# Patient Record
Sex: Female | Born: 1935 | Race: Black or African American | Hispanic: No | Smoking: Never smoker
Health system: Southern US, Community
[De-identification: ages and names within clinical notes are randomized; demographics above are authoritative.]

## PROBLEM LIST (undated history)

## (undated) DIAGNOSIS — M1990 Unspecified osteoarthritis, unspecified site: Secondary | ICD-10-CM

## (undated) DIAGNOSIS — L409 Psoriasis, unspecified: Secondary | ICD-10-CM

## (undated) DIAGNOSIS — D649 Anemia, unspecified: Secondary | ICD-10-CM

## (undated) DIAGNOSIS — Z973 Presence of spectacles and contact lenses: Secondary | ICD-10-CM

## (undated) DIAGNOSIS — I10 Essential (primary) hypertension: Secondary | ICD-10-CM

## (undated) DIAGNOSIS — E785 Hyperlipidemia, unspecified: Secondary | ICD-10-CM

## (undated) DIAGNOSIS — H269 Unspecified cataract: Secondary | ICD-10-CM

## (undated) DIAGNOSIS — M2550 Pain in unspecified joint: Secondary | ICD-10-CM

## (undated) DIAGNOSIS — R413 Other amnesia: Secondary | ICD-10-CM

## (undated) DIAGNOSIS — C55 Malignant neoplasm of uterus, part unspecified: Secondary | ICD-10-CM

## (undated) DIAGNOSIS — N950 Postmenopausal bleeding: Secondary | ICD-10-CM

## (undated) HISTORY — DX: Malignant neoplasm of uterus, part unspecified: C55

## (undated) HISTORY — PX: TONSILLECTOMY: SUR1361

## (undated) HISTORY — PX: COLONOSCOPY: SHX174

---

## 1998-12-01 ENCOUNTER — Other Ambulatory Visit: Admission: RE | Admit: 1998-12-01 | Discharge: 1998-12-01 | Payer: Self-pay | Admitting: Gynecology

## 1999-12-11 ENCOUNTER — Other Ambulatory Visit: Admission: RE | Admit: 1999-12-11 | Discharge: 1999-12-11 | Payer: Self-pay | Admitting: Gynecology

## 2001-03-25 ENCOUNTER — Encounter: Payer: Self-pay | Admitting: Specialist

## 2001-03-25 ENCOUNTER — Ambulatory Visit (HOSPITAL_COMMUNITY): Admission: RE | Admit: 2001-03-25 | Discharge: 2001-03-25 | Payer: Self-pay | Admitting: Specialist

## 2001-03-25 HISTORY — PX: KNEE ARTHROSCOPY: SUR90

## 2001-06-10 ENCOUNTER — Encounter: Admission: RE | Admit: 2001-06-10 | Discharge: 2001-06-10 | Payer: Self-pay | Admitting: Specialist

## 2001-06-10 ENCOUNTER — Encounter: Payer: Self-pay | Admitting: Specialist

## 2001-08-21 ENCOUNTER — Inpatient Hospital Stay (HOSPITAL_COMMUNITY): Admission: EM | Admit: 2001-08-21 | Discharge: 2001-08-23 | Payer: Self-pay | Admitting: Emergency Medicine

## 2001-09-02 ENCOUNTER — Encounter: Admission: RE | Admit: 2001-09-02 | Discharge: 2001-09-02 | Payer: Self-pay | Admitting: Family Medicine

## 2001-10-16 ENCOUNTER — Ambulatory Visit (HOSPITAL_COMMUNITY): Admission: RE | Admit: 2001-10-16 | Discharge: 2001-10-16 | Payer: Self-pay | Admitting: Gynecology

## 2001-10-16 ENCOUNTER — Encounter: Payer: Self-pay | Admitting: Gynecology

## 2001-11-10 ENCOUNTER — Encounter: Payer: Self-pay | Admitting: Gynecology

## 2001-11-12 ENCOUNTER — Encounter (INDEPENDENT_AMBULATORY_CARE_PROVIDER_SITE_OTHER): Payer: Self-pay

## 2001-11-12 ENCOUNTER — Ambulatory Visit (HOSPITAL_COMMUNITY): Admission: RE | Admit: 2001-11-12 | Discharge: 2001-11-12 | Payer: Self-pay | Admitting: Gynecology

## 2001-11-12 HISTORY — PX: DILATION AND CURETTAGE OF UTERUS: SHX78

## 2003-01-19 ENCOUNTER — Other Ambulatory Visit: Admission: RE | Admit: 2003-01-19 | Discharge: 2003-01-19 | Payer: Self-pay | Admitting: Gynecology

## 2006-12-27 ENCOUNTER — Other Ambulatory Visit: Admission: RE | Admit: 2006-12-27 | Discharge: 2006-12-27 | Payer: Self-pay | Admitting: Family Medicine

## 2010-02-15 ENCOUNTER — Encounter: Admission: RE | Admit: 2010-02-15 | Discharge: 2010-02-15 | Payer: Self-pay | Admitting: Orthopaedic Surgery

## 2010-12-22 NOTE — Discharge Summary (Signed)
[place]. [facility]  Patient:    Cristina Barton, Cristina Barton Visit Number: [id number] MRN: [id number]          Service Type: MED Location: [id number] Attending Physician:  McDiarmid, Leighton Roach. Dictated by:   Emmit Alexanders, M.D. Admit Date:  08/21/2001 Discharge Date: 08/23/2001   CC:         Maricela Bo, M.D.                           Discharge Summary  DISCHARGE DIAGNOSES: 1. 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 2. Hypertension. 3. Osteoarthritis.  CONSULTING PHYSICIANS:  Dermatology was unavailable for consult.  LABORATORY DATA:  The patients CBC at discharge showed a white blood cell count of 8.2, hemoglobin 12.4, hematocrit 36.1, and platelet count 188,000. BMET at discharge showed sodium 140, potassium 4.5, chloride 108, CO2 28, BUN 7, creatinine 0.8, glucose 165.  LFTs showed AST 29, ALT 27, alkaline phosphatase 51, total bilirubin 0.3.  Peripheral smear to rule out leukemia and lymphoma was pending at discharge.  HOSPITAL COURSE:  #1 - Erythroderma.  This is a 75 year old African-American female who presented to her primary care physicians office with a rash starting on December 18.  Rash seemed to worsen despite systemic steroids.  She was also being treated with Periactin for itching p.r.n.  The patient was seen by her dermatologist who recommended admission with the diagnosis of erythroderma. She was therefore admitted for observation to make sure her vital signs remain stable and that her skin integrity is remaining intact.  In the hospital, she was treated with triamcinolone 1% cream apply to the skin as needed.  She was also maintained on Periactin as she was as an outpatient.  Over the two-day course, the patients symptoms remained stable.  She had a diffuse erythematous confluent rash involving her face, torso, and arms as well as her upper legs.  It spared the palms and soles of her feet.  There were some associated  satelite pinpoint nonblanching lesions at the periphery of her rash involving the anterior aspect of her legs.  There is diffuse scaling throughout her body as well.  Her vital signs remained stable throughout her course.  She was given intravenous fluids, but was tolerating p.o. well.  She will be discharged to home with follow-up with her primary care physician, but in the meantime should maintain using her triamcinolone cream and Periactin. We discontinued her Diltiazem while she was in the hospital as this may be contributing factor.  Other possible causes include Celebrex which she was taking for six months prior to her rash.  Of note, she also took ciprofloxacin at her urologists office on November 25.  This may definitely also be contributing as well.  For now, she should discontinue all unnecessary medicines except for those for support.  She should ask her primary care physician for recommendations on when to continue these.  We also recommend a punch biopsy of a lesion to rule out any primary dermatological disease. This can be done as a outpatient.  Her peripheral blood smear should also be followed up to rule out any associated leukemias or lymphomas.  She does not have any risk factors for any other associated cancers including cervical, lung, or colon cancer.  #2 - Hypertension.  The patients blood pressure remained stable throughout her course.  She was initially given her Diltiazem as she takes as an outpatient.  However,  this was discontinued on her second day secondary to avoiding any possible contributing sequelae from taking this medicine.  She should follow the advice of her primary care physician regarding resuming taking this medicine.  #3 - Osteoarthritis.  The patients arthritis was stable.  She was not given any medicine in the hospital.  Again, as above, Celebrex may be contributing to this rash.  She should not take any further medicines until she has  further given advice to do so.  DISCHARGE MEDICATIONS: 1. Triamcinolone cream 1% apply to skin p.r.n. 2. Periactin 4 mg p.o. t.i.d.  ACTIVITY:  As tolerated.  She may take baths with a mild soap such as Dove soap.  If her rash worsens, she should stop all daily activity.  DIET:  No restrictions.  DISCHARGE INSTRUCTIONS:  She is to come back to the hospital for worsening of rash, difficulty breathing, chest pain, or lightheadedness.  FOLLOW-UP:  She is to call Dr. Chrys Racer office for an appointment as early as possible.  She should also possibly call her dermatologist for a repeat follow-up appointment. Dictated by:   Emmit Alexanders, M.D. Attending Physician:  McDiarmid, Tawanna Cooler D. DD:  08/23/01 TD:  08/25/01 Job: [id number]

## 2010-12-22 NOTE — Op Note (Signed)
[facility]  Patient:    Cristina Barton, Cristina Barton Visit Number: [id number] MRN: [id number]          Service Type: DSU Location: DAY Attending Physician:  Rocky Crafts Proc. Date: 03/25/01 Adm. Date:  03/25/2001                             Operative Report  HISTORY:  This is a 75 year old with left knee pain for two months along with some knee swelling.  She has not had good relief with medication.  An MRI of the left knee reported a complex tear of the medial meniscus.  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  She had moderate knee effusion and moderate Bakers cyst. It was hoped that most of the pain was coming from the complex tear of the medial meniscus than from the aseptic necrosis.  DESCRIPTION OF PROCEDURE:  After suitable general anesthesia, the knee was prepped and draped routinely for arthroscopy.  After exsanguination, upper thigh tourniquet was inflated to 400 mmHg.  Routine portals were created with a lateral parapatellar inflow portal and an anterolateral portal for the scope, and an inferomedial portal made under direct vision, bringing a probe. The medial meniscus was inspected first and probed.  There was some mild degenerative changes over the medial femoral condyle but nothing specific. The meniscus itself was probed normal and although posterior seemed a little thicker, it was perfectly normal, and there was no evidence of any instability, tearing, or problems within it, despite the MRI report.  The lateral meniscus looked quite good as well.  There were no changes on the articular surface of the lateral femoral condyle.  It was felt that the meniscal tear, as reported by the MRI, was not a significant tear and not a significant part of her pain problem.  That leads [place] to the  spontaneous osteonecrosis as the probable cause.  At the end of the procedure, 20 cc of 0.5% plain Marcaine in the knee with some in the portals and a nice compression dressing.  She goes to recovery in good condition. Attending Physician:  Rocky Crafts DD:  03/25/01 TD:  03/25/01 Job: [id number]

## 2010-12-22 NOTE — Op Note (Signed)
[facility]  Patient:    Cristina Barton, Cristina Barton Visit Number: [id number] MRN: [id number]          Service Type: DSU Location: [facility] Attending Physician:  Susa Raring Dictated by:   Luvenia Redden, M.D. Proc. Date: 11/12/01 Admit Date:  11/12/2001                             Operative Report  PREOPERATIVE DIAGNOSES:       Postmenopausal bleeding, uterine fibroids.  POSTOPERATIVE DIAGNOSES:      Postmenopausal bleeding, uterine fibroids, rule out endometrial carcinoma.  SURGEON:                      Luvenia Redden, M.D.  PROCEDURE:                    Under good anesthesia the patient is prepped and draped in a sterile manner.  Bladder was catheterized.  Examination reveals the uterus anterior, enlarged, irregular, and about 10-12 weeks or so gestational size.  No adnexal masses could be palpated separately from the uterus.  Cervix was grasped with a tenaculum and the os was stenotic.  It was sounded with a little bit of difficulty, but was sounded successfully.  The uterine cavity was 10-11 cm deep.  Cervix was dilated up with cervical dilators.  The endocervical canal was curetted.  This was sent as a separate specimen.  The cavity was then explored with polyp forceps and several fragments of tissue were obtained.  The endometrial cavity was then scraped thoroughly with a sharp curette.  A small to moderate amount of tissue was obtained.  The uterine cavity was then wiped with a dry sponge.  There was no residual tissue that came out with the sponge.  There was no excess bleeding. Patient tolerated the procedure well and was removed to recovery in good condition.  Estimated blood loss 25 cc.  None replaced. Dictated by:   Luvenia Redden, M.D. Attending Physician:  Susa Raring DD:  11/12/01 TD:  11/12/01 Job: [id number]

## 2010-12-22 NOTE — H&P (Signed)
[place]. [facility]  Patient:    Cristina Barton, WAITMAN Visit Number: [id number] MRN: [id number]          Service Type: MED Location: 5500 [id number] Attending Physician:  Adonis Housekeeper Dictated by:   Gwenlyn Perking, M.D. Admit Date:  08/21/2001   CC:         Maricela Bo, M.D., [place],    History and Physical  CHIEF COMPLAINT/PROBLEM LIST:  1. Rash.  2. Hypertension.  PRIMARY CARE PHYSICIAN: Maricela Bo, M.D., [place], [place].  HISTORY OF PRESENT ILLNESS: The patient is a 75 year old African-American female, with a history of a rash since July 23, 2001.  The patient states that she went to see her primary M.D., Dr. Purnell Shoemaker in [place], [place], who gave her a shot and stopped her Celebrex that she takes for osteoarthritis and that she has been taking for the last half year.  This really did not improve her rash and Dr. Purnell Shoemaker gave her a prescription for cyproheptadine as well as Eucerin mixed with hydrocortisone, which also did not improve the rash.  She also recalls that she went to have an ultrasound of her gallbladder done in the beginning of December 2002 in [place], [place] with a urologist, an "[place] doctor," at which time she was given seven pills which made her very nauseated.  Shortly thereafter her rash came on.  The patient denies any history of weight loss or arthralgias or myalgias.  No exposure to ticks or insects.  Denies history of fevers and chills until about a week ago, and now feels subjectively hot and feverish for the last seven days.  Does not recall ever having had a rash like this before, nor does anybody else in her family have a rash like that.  PAST MEDICAL HISTORY:  1. Hypertension.  2. Osteoarthritis.  PAST SURGICAL HISTORY: Arthroscopy on right knee.  ALLERGIES: Questionable, CELEBREX.  MEDICATIONS:  1. Evista was stopped.  2. Celebrex was stopped.  3. Methylprednisolone, patient  just finished a taper.  4. Cyproheptadine 4 mg p.o. t.i.d. p.r.n.  5. Cardizem 240 mg p.o. q.d.  FAMILY HISTORY: No rashes.  Has two sisters who are alive and healthy.  SOCIAL HISTORY: The patient is married.  Disabled from osteoarthritis of her lower extremities.  The patient has worked at a [employer] all of her life. No smoking, no alcohol.  REVIEW OF SYSTEMS: GENERAL: No weight loss.  Positive appetite.  Positive chills and fevers x7 days.  HEENT: Noncontributory.  CARDIOVASCULAR: No chest pain.  RESPIRATORY: No cough, no wheezing, no shortness of breath, no dyspnea on exertion.  GI: Bowel movement daily.  No melena, no hematochezia, no nausea, no vomiting.  EXTREMITIES: No swelling or joint pains.  GU: Positive for bladder problems, for which she had ultrasound as noted in History of Present Illness.  NEUROLOGIC: No history of stroke.  ENDOCRINOLOGIC: No history of diabetes.  PHYSICAL EXAMINATION:  VITAL SIGNS: Temperature 98.5 degrees, heart rate 80, blood pressure 129/70, respirations 20.  GENERAL: Well-developed, well-nourished 75 year old African-American female, awake and alert, who appears comfortable and in no apparent distress.  HEENT: Normocephalic, atraumatic.  PERRLA.  EOMI.  Conjunctivae anicteric and do not have any lesions.  Eyelids are mildly erythematous and edematous bilaterally.  Ears, nose, and throat otherwise within normal limits. Specifically, the mouth does not contain any lesions or blisters.  There is, furthermore, no angioedema noted of her lips.  NECK: No bruits.  CARDIOVASCULAR:  Regular rate and rhythm without murmurs, rubs, or gallops.  RESPIRATORY: Clear to auscultation bilaterally.  ABDOMEN: Protuberant but soft.  Normoactive bowel sounds.  No hepatosplenomegaly.  EXTREMITIES: No clubbing, cyanosis, or edema.  NEUROLOGIC: Cranial nerves II-XII grossly intact.  No focal sensory or motor deficits.  No ataxia.  SKIN: Confluent  erythematous rash on torso, back, and proximal upper and lower extremities, as well as face.  There is very distinct sparing of her palms, soles, dorsum of her feet and hands noted.  LABORATORY DATA: H&H 13.1 and 39.5, WBC 12.1 with Differential of 74% neutrophils, 16% lymphocytes, 7% monocytes, 3% eosinophils, 1% basophils; platelets 165,000; MCV 85; RDW 14.9.  Serum electrolytes as follows: Sodium 140, potassium 4.1, BUN 10, creatinine 0.9, chloride 104, CO2 29, glucose 115. AST and ALT 29 and 27 respectively.  ASSESSMENT: This is a 75 year old white female with generalized rash, nonpruritic, with sparing of hands and feet.  According to the dermatologist in [place], [place] this is erythroderma, possibly related to ingestion of sulfa containing drugs and a subsequent drug reaction (Celebrex). Patient also with history of hypertension, which appears to be well controlled.  PLAN:  1. Admit patient to family practice teaching service.  2. Supportive treatment with electrolytes, fluid, and close monitoring of     vitals.  3. Symptomatic treatment with cyproheptadine as well as triamcinolone     cream 0.1%, but no systemic steroids on advice of dermatologist in     [place], [place].  4. Will continue Cardizem 240 mg p.o. q.d.  5. Discussed this patient with attending physician, Dr. Guadalupe Dawn. Dictated by:   Gwenlyn Perking, M.D. Attending Physician:  Adonis Housekeeper. DD:  08/21/01 TD:  08/22/01 Job: [id number]

## 2011-03-02 ENCOUNTER — Other Ambulatory Visit: Payer: Self-pay | Admitting: Family Medicine

## 2011-03-02 DIAGNOSIS — Z1231 Encounter for screening mammogram for malignant neoplasm of breast: Secondary | ICD-10-CM

## 2011-03-07 ENCOUNTER — Ambulatory Visit
Admission: RE | Admit: 2011-03-07 | Discharge: 2011-03-07 | Disposition: A | Discharge status: Home / Self Care | Payer: Medicare Other | Source: Ambulatory Visit | Attending: Family Medicine | Admitting: Family Medicine

## 2011-03-07 DIAGNOSIS — Z1231 Encounter for screening mammogram for malignant neoplasm of breast: Secondary | ICD-10-CM

## 2011-03-12 ENCOUNTER — Other Ambulatory Visit: Payer: Self-pay | Admitting: Family Medicine

## 2011-03-12 DIAGNOSIS — R928 Other abnormal and inconclusive findings on diagnostic imaging of breast: Secondary | ICD-10-CM

## 2011-03-16 ENCOUNTER — Ambulatory Visit
Admission: RE | Admit: 2011-03-16 | Discharge: 2011-03-16 | Disposition: A | Discharge status: Home / Self Care | Payer: Medicare Other | Source: Ambulatory Visit | Attending: Family Medicine | Admitting: Family Medicine

## 2011-03-16 DIAGNOSIS — R928 Other abnormal and inconclusive findings on diagnostic imaging of breast: Secondary | ICD-10-CM

## 2011-08-13 DIAGNOSIS — L408 Other psoriasis: Secondary | ICD-10-CM | POA: Diagnosis not present

## 2011-08-13 DIAGNOSIS — Z79899 Other long term (current) drug therapy: Secondary | ICD-10-CM | POA: Diagnosis not present

## 2011-09-05 DIAGNOSIS — Z79899 Other long term (current) drug therapy: Secondary | ICD-10-CM | POA: Diagnosis not present

## 2011-09-05 DIAGNOSIS — E7800 Pure hypercholesterolemia, unspecified: Secondary | ICD-10-CM | POA: Diagnosis not present

## 2011-09-07 DIAGNOSIS — I10 Essential (primary) hypertension: Secondary | ICD-10-CM | POA: Diagnosis not present

## 2011-09-07 DIAGNOSIS — E7800 Pure hypercholesterolemia, unspecified: Secondary | ICD-10-CM | POA: Diagnosis not present

## 2011-09-07 DIAGNOSIS — R7309 Other abnormal glucose: Secondary | ICD-10-CM | POA: Diagnosis not present

## 2011-09-28 DIAGNOSIS — H2510 Age-related nuclear cataract, unspecified eye: Secondary | ICD-10-CM | POA: Diagnosis not present

## 2012-01-14 DIAGNOSIS — J069 Acute upper respiratory infection, unspecified: Secondary | ICD-10-CM | POA: Diagnosis not present

## 2012-01-14 DIAGNOSIS — R05 Cough: Secondary | ICD-10-CM | POA: Diagnosis not present

## 2012-02-18 DIAGNOSIS — K5730 Diverticulosis of large intestine without perforation or abscess without bleeding: Secondary | ICD-10-CM | POA: Diagnosis not present

## 2012-02-18 DIAGNOSIS — Z86010 Personal history of colonic polyps: Secondary | ICD-10-CM | POA: Diagnosis not present

## 2012-02-18 DIAGNOSIS — Z09 Encounter for follow-up examination after completed treatment for conditions other than malignant neoplasm: Secondary | ICD-10-CM | POA: Diagnosis not present

## 2012-02-26 DIAGNOSIS — M545 Low back pain: Secondary | ICD-10-CM | POA: Diagnosis not present

## 2012-02-26 DIAGNOSIS — M48061 Spinal stenosis, lumbar region without neurogenic claudication: Secondary | ICD-10-CM | POA: Diagnosis not present

## 2012-02-26 DIAGNOSIS — IMO0002 Reserved for concepts with insufficient information to code with codable children: Secondary | ICD-10-CM | POA: Diagnosis not present

## 2012-03-03 DIAGNOSIS — M5137 Other intervertebral disc degeneration, lumbosacral region: Secondary | ICD-10-CM | POA: Diagnosis not present

## 2012-03-07 ENCOUNTER — Other Ambulatory Visit (HOSPITAL_COMMUNITY): Payer: Self-pay | Admitting: Orthopaedic Surgery

## 2012-03-07 DIAGNOSIS — M545 Low back pain: Secondary | ICD-10-CM | POA: Diagnosis not present

## 2012-03-07 DIAGNOSIS — M48061 Spinal stenosis, lumbar region without neurogenic claudication: Secondary | ICD-10-CM | POA: Diagnosis not present

## 2012-03-10 DIAGNOSIS — L408 Other psoriasis: Secondary | ICD-10-CM | POA: Diagnosis not present

## 2012-03-11 ENCOUNTER — Encounter (HOSPITAL_COMMUNITY)
Admission: RE | Admit: 2012-03-11 | Discharge: 2012-03-11 | Disposition: A | Discharge status: Home / Self Care | Payer: Medicare Other | Source: Ambulatory Visit | Attending: Orthopaedic Surgery | Admitting: Orthopaedic Surgery

## 2012-03-11 ENCOUNTER — Encounter (HOSPITAL_COMMUNITY): Payer: Self-pay

## 2012-03-11 ENCOUNTER — Encounter (HOSPITAL_COMMUNITY): Payer: Self-pay | Admitting: Pharmacy Technician

## 2012-03-11 DIAGNOSIS — M48062 Spinal stenosis, lumbar region with neurogenic claudication: Secondary | ICD-10-CM | POA: Diagnosis not present

## 2012-03-11 DIAGNOSIS — Z01810 Encounter for preprocedural cardiovascular examination: Secondary | ICD-10-CM | POA: Diagnosis not present

## 2012-03-11 DIAGNOSIS — M129 Arthropathy, unspecified: Secondary | ICD-10-CM | POA: Diagnosis not present

## 2012-03-11 DIAGNOSIS — L408 Other psoriasis: Secondary | ICD-10-CM | POA: Diagnosis not present

## 2012-03-11 DIAGNOSIS — Z01812 Encounter for preprocedural laboratory examination: Secondary | ICD-10-CM | POA: Diagnosis not present

## 2012-03-11 DIAGNOSIS — I10 Essential (primary) hypertension: Secondary | ICD-10-CM | POA: Diagnosis not present

## 2012-03-11 DIAGNOSIS — Z5189 Encounter for other specified aftercare: Secondary | ICD-10-CM | POA: Diagnosis not present

## 2012-03-11 DIAGNOSIS — E785 Hyperlipidemia, unspecified: Secondary | ICD-10-CM | POA: Diagnosis not present

## 2012-03-11 HISTORY — DX: Hyperlipidemia, unspecified: E78.5

## 2012-03-11 HISTORY — DX: Unspecified osteoarthritis, unspecified site: M19.90

## 2012-03-11 HISTORY — DX: Pain in unspecified joint: M25.50

## 2012-03-11 HISTORY — DX: Unspecified cataract: H26.9

## 2012-03-11 HISTORY — DX: Psoriasis, unspecified: L40.9

## 2012-03-11 HISTORY — DX: Essential (primary) hypertension: I10

## 2012-03-11 LAB — COMPREHENSIVE METABOLIC PANEL
ALT: 16 U/L (ref 0–35)
AST: 19 U/L (ref 0–37)
Albumin: 3.7 g/dL (ref 3.5–5.2)
CO2: 29 mEq/L (ref 19–32)
Chloride: 104 mEq/L (ref 96–112)
GFR calc non Af Amer: 84 mL/min — ABNORMAL LOW (ref >90)
Sodium: 141 mEq/L (ref 135–145)
Total Bilirubin: 0.3 mg/dL (ref 0.3–1.2)

## 2012-03-11 LAB — CBC
Platelets: 178 10*3/uL (ref 150–400)
RBC: 5.08 MIL/uL (ref 3.87–5.11)
RDW: 13.2 % (ref 11.5–15.5)
WBC: 7.2 10*3/uL (ref 4.0–10.5)

## 2012-03-11 LAB — SURGICAL PCR SCREEN: Staphylococcus aureus: NEGATIVE

## 2012-03-11 MED ORDER — CEFAZOLIN SODIUM-DEXTROSE 2-3 GM-% IV SOLR
2.0000 g | INTRAVENOUS | Status: AC
  Administered 2012-03-12: 2 g via INTRAVENOUS

## 2012-03-11 NOTE — Consult Note (Signed)
Anesthesia Chart Review:  Patient is a 76 year old female posted for microdiskectomy/lumbar laminectomy (orders pending) on 03/12/12 by Dr. Ophelia Charter.  Her PAT appointment was earlier today, 03/11/12.    History includes non-smoker, obesity with BMI 33, HTN, HLD, cataracts, OA, psoriasis, prior tonsillectomy.  PCP is Dr. Nathanial Rancher in [place].  She denies prior cardiac testing and is not followed by a Cardiologist.  She has no documented history of CAD/MI/CHF or DM.  CXR on 03/11/12 showed no evidence of acute cardiopulmonary disease.    Labs from 03/11/12 are acceptable.  She is scheduled for a PT/PTT on the day of surgery.  EKG today showed SR with PAC, cannot rule out anterior infarct (age undetermined)--poor r wave progression from V2-V3.  She had this on an EKG from 11/10/01, but it is more apparent now.  Her other leads appear fairly similar.  Patient had already left the hospital before I was given her EKGs to review.  Clinical correlation on the day of surgery.  Would anticipate that if she is asymptomatic (no documented CV symptoms from her PAT appointment) that she could proceed, however, her assigned Anesthesiologist will evaluate her on the day of surgery.  Shonna Chock, PA-C 03/11/12 1600

## 2012-03-11 NOTE — Progress Notes (Signed)
i called hamrick's office to request an ekg for comparison to today's but per office pt hasn't had any others to send me

## 2012-03-11 NOTE — Progress Notes (Addendum)
Pt doesn't have a  Cardiologist  Denies ever having an echo/stress test/heart cath  Medical MD is Dr.Hamrick in [place]-ph # [phone]  Pt denies having an ekg or cxr within past yr

## 2012-03-11 NOTE — Progress Notes (Signed)
Pt notified to arrive at 10:30 d/t 28m time change;pt verbalized understanding

## 2012-03-11 NOTE — Pre-Procedure Instructions (Signed)
20 Cristina Barton  03/11/2012   Your procedure is scheduled on:  Wed, Aug 7 @ 12:45 PM  Report to [facility] at 10:45 AM.  Call this number if you have problems the morning of surgery: [phone]   Remember:   Do not eat food:After Midnight.    Take these medicines the morning of surgery with A SIP OF WATER:    Do not wear jewelry, make-up or nail polish.  Do not wear lotions, powders, or perfumes.   Do not shave 48 hours prior to surgery.  Do not bring valuables to the hospital.  Contacts, dentures or bridgework may not be worn into surgery.  Leave suitcase in the car. After surgery it may be brought to your room.  For patients admitted to the hospital, checkout time is 11:00 AM the day of discharge.   Patients discharged the day of surgery will not be allowed to drive home.    Special Instructions: CHG Shower Use Special Wash: 1/2 bottle night before surgery and 1/2 bottle morning of surgery.   Please read over the following fact sheets that you were given: Pain Booklet, Coughing and Deep Breathing, MRSA Information and Surgical Site Infection Prevention

## 2012-03-12 ENCOUNTER — Inpatient Hospital Stay (HOSPITAL_COMMUNITY): Payer: Medicare Other | Admitting: Vascular Surgery

## 2012-03-12 ENCOUNTER — Encounter (HOSPITAL_COMMUNITY): Payer: Self-pay | Admitting: Vascular Surgery

## 2012-03-12 ENCOUNTER — Ambulatory Visit (HOSPITAL_COMMUNITY)
Admission: RE | Admit: 2012-03-12 | Discharge: 2012-03-13 | Disposition: A | Discharge status: Home / Self Care | Payer: Medicare Other | Source: Ambulatory Visit | Attending: Orthopaedic Surgery | Admitting: Orthopaedic Surgery

## 2012-03-12 ENCOUNTER — Encounter (HOSPITAL_COMMUNITY): Payer: Self-pay | Admitting: Surgery

## 2012-03-12 ENCOUNTER — Inpatient Hospital Stay (HOSPITAL_COMMUNITY): Payer: Medicare Other

## 2012-03-12 ENCOUNTER — Encounter (HOSPITAL_COMMUNITY)
Admission: RE | Disposition: A | Discharge status: Home / Self Care | Payer: Self-pay | Source: Ambulatory Visit | Attending: Orthopaedic Surgery

## 2012-03-12 ENCOUNTER — Encounter (HOSPITAL_COMMUNITY): Payer: Self-pay | Admitting: General Practice

## 2012-03-12 DIAGNOSIS — Z01812 Encounter for preprocedural laboratory examination: Secondary | ICD-10-CM | POA: Insufficient documentation

## 2012-03-12 DIAGNOSIS — E785 Hyperlipidemia, unspecified: Secondary | ICD-10-CM | POA: Insufficient documentation

## 2012-03-12 DIAGNOSIS — M48061 Spinal stenosis, lumbar region without neurogenic claudication: Secondary | ICD-10-CM | POA: Diagnosis not present

## 2012-03-12 DIAGNOSIS — Z01810 Encounter for preprocedural cardiovascular examination: Secondary | ICD-10-CM | POA: Insufficient documentation

## 2012-03-12 DIAGNOSIS — M545 Low back pain: Secondary | ICD-10-CM | POA: Diagnosis not present

## 2012-03-12 DIAGNOSIS — M129 Arthropathy, unspecified: Secondary | ICD-10-CM | POA: Insufficient documentation

## 2012-03-12 DIAGNOSIS — I10 Essential (primary) hypertension: Secondary | ICD-10-CM | POA: Insufficient documentation

## 2012-03-12 DIAGNOSIS — L408 Other psoriasis: Secondary | ICD-10-CM | POA: Insufficient documentation

## 2012-03-12 DIAGNOSIS — M48062 Spinal stenosis, lumbar region with neurogenic claudication: Secondary | ICD-10-CM | POA: Insufficient documentation

## 2012-03-12 HISTORY — PX: LUMBAR LAMINECTOMY: SHX95

## 2012-03-12 HISTORY — PX: LAMINECTOMY AND MICRODISCECTOMY LUMBAR SPINE: SHX1913

## 2012-03-12 LAB — PROTIME-INR
INR: 1.08 (ref 0.00–1.49)
Prothrombin Time: 14.2 seconds (ref 11.6–15.2)

## 2012-03-12 LAB — APTT: aPTT: 28 seconds (ref 24–37)

## 2012-03-12 SURGERY — MICRODISCECTOMY LUMBAR LAMINECTOMY
Anesthesia: General | Site: Back | Wound class: Clean

## 2012-03-12 MED ORDER — 0.9 % SODIUM CHLORIDE (POUR BTL) OPTIME
TOPICAL | Status: DC | PRN
  Administered 2012-03-12: 1000 mL

## 2012-03-12 MED ORDER — METHOCARBAMOL 500 MG PO TABS
500.0000 mg | ORAL_TABLET | Freq: Four times a day (QID) | ORAL | Status: DC | PRN
  Filled 2012-03-12: qty 1

## 2012-03-12 MED ORDER — MORPHINE SULFATE 2 MG/ML IJ SOLN: 1.0000 mg | INTRAMUSCULAR | Status: DC | PRN

## 2012-03-12 MED ORDER — BUPIVACAINE HCL 0.75 % IJ SOLN
INTRAMUSCULAR | Status: AC
  Filled 2012-03-12: qty 10

## 2012-03-12 MED ORDER — VERAPAMIL HCL ER 240 MG PO TBCR
240.0000 mg | EXTENDED_RELEASE_TABLET | Freq: Every day | ORAL | Status: DC
  Administered 2012-03-12: 240 mg via ORAL
  Filled 2012-03-12 (×2): qty 1

## 2012-03-12 MED ORDER — BISACODYL 10 MG RE SUPP: 10.0000 mg | Freq: Every day | RECTAL | Status: DC | PRN

## 2012-03-12 MED ORDER — BUPIVACAINE HCL (PF) 0.25 % IJ SOLN
INTRAMUSCULAR | Status: DC | PRN
  Administered 2012-03-12: 30 mL

## 2012-03-12 MED ORDER — BUPIVACAINE HCL (PF) 0.25 % IJ SOLN
INTRAMUSCULAR | Status: AC
  Filled 2012-03-12: qty 30

## 2012-03-12 MED ORDER — KETOROLAC TROMETHAMINE 30 MG/ML IJ SOLN
30.0000 mg | Freq: Once | INTRAMUSCULAR | Status: AC
  Administered 2012-03-12: 30 mg via INTRAVENOUS
  Filled 2012-03-12: qty 1

## 2012-03-12 MED ORDER — DOCUSATE SODIUM 100 MG PO CAPS
100.0000 mg | ORAL_CAPSULE | Freq: Two times a day (BID) | ORAL | Status: DC
  Administered 2012-03-12 – 2012-03-13 (×2): 100 mg via ORAL
  Filled 2012-03-12 (×2): qty 1

## 2012-03-12 MED ORDER — CEFAZOLIN SODIUM 1-5 GM-% IV SOLN
1.0000 g | Freq: Three times a day (TID) | INTRAVENOUS | Status: AC
  Administered 2012-03-12 – 2012-03-13 (×2): 1 g via INTRAVENOUS
  Filled 2012-03-12 (×2): qty 50

## 2012-03-12 MED ORDER — SODIUM CHLORIDE 0.9 % IJ SOLN: 3.0000 mL | INTRAMUSCULAR | Status: DC | PRN

## 2012-03-12 MED ORDER — LACTATED RINGERS IV SOLN
INTRAVENOUS | Status: DC
  Administered 2012-03-12: 12:00:00 via INTRAVENOUS

## 2012-03-12 MED ORDER — PHENYLEPHRINE HCL 10 MG/ML IJ SOLN
10.0000 mg | INTRAVENOUS | Status: DC | PRN
  Administered 2012-03-12: 10 ug/min via INTRAVENOUS

## 2012-03-12 MED ORDER — SODIUM CHLORIDE 0.9 % IV SOLN: 250.0000 mL | INTRAVENOUS | Status: DC

## 2012-03-12 MED ORDER — ONDANSETRON HCL 4 MG/2ML IJ SOLN
INTRAMUSCULAR | Status: DC | PRN
  Administered 2012-03-12: 4 mg via INTRAVENOUS

## 2012-03-12 MED ORDER — POTASSIUM CHLORIDE IN NACL 20-0.45 MEQ/L-% IV SOLN
INTRAVENOUS | Status: DC
  Administered 2012-03-12: 19:00:00 via INTRAVENOUS
  Filled 2012-03-12 (×3): qty 1000

## 2012-03-12 MED ORDER — ACETAMINOPHEN 650 MG RE SUPP: 650.0000 mg | RECTAL | Status: DC | PRN

## 2012-03-12 MED ORDER — HYDROMORPHONE HCL PF 1 MG/ML IJ SOLN
0.2500 mg | INTRAMUSCULAR | Status: DC | PRN
  Administered 2012-03-12: 0.5 mg via INTRAVENOUS

## 2012-03-12 MED ORDER — PROPOFOL 10 MG/ML IV EMUL
INTRAVENOUS | Status: DC | PRN
  Administered 2012-03-12: 200 mg via INTRAVENOUS

## 2012-03-12 MED ORDER — LIDOCAINE HCL (CARDIAC) 20 MG/ML IV SOLN
INTRAVENOUS | Status: DC | PRN
  Administered 2012-03-12: 100 mg via INTRAVENOUS

## 2012-03-12 MED ORDER — ACETAMINOPHEN 325 MG PO TABS: 650.0000 mg | ORAL_TABLET | ORAL | Status: DC | PRN

## 2012-03-12 MED ORDER — HYDROMORPHONE HCL PF 1 MG/ML IJ SOLN
INTRAMUSCULAR | Status: AC
  Filled 2012-03-12: qty 1

## 2012-03-12 MED ORDER — PANTOPRAZOLE SODIUM 40 MG IV SOLR
40.0000 mg | Freq: Every day | INTRAVENOUS | Status: DC
  Administered 2012-03-12: 40 mg via INTRAVENOUS
  Filled 2012-03-12 (×2): qty 40

## 2012-03-12 MED ORDER — SUFENTANIL CITRATE 50 MCG/ML IV SOLN
INTRAVENOUS | Status: DC | PRN
  Administered 2012-03-12: 20 ug via INTRAVENOUS
  Administered 2012-03-12: 5 ug via INTRAVENOUS

## 2012-03-12 MED ORDER — OXYCODONE-ACETAMINOPHEN 5-325 MG PO TABS: 1.0000 | ORAL_TABLET | ORAL | Status: DC | PRN

## 2012-03-12 MED ORDER — METHOCARBAMOL 100 MG/ML IJ SOLN
500.0000 mg | Freq: Four times a day (QID) | INTRAVENOUS | Status: DC | PRN
  Administered 2012-03-12: 500 mg via INTRAVENOUS
  Filled 2012-03-12: qty 5

## 2012-03-12 MED ORDER — PHENOL 1.4 % MT LIQD: 1.0000 | OROMUCOSAL | Status: DC | PRN

## 2012-03-12 MED ORDER — LACTATED RINGERS IV SOLN
INTRAVENOUS | Status: DC | PRN
  Administered 2012-03-12 (×2): via INTRAVENOUS

## 2012-03-12 MED ORDER — NEOSTIGMINE METHYLSULFATE 1 MG/ML IJ SOLN
INTRAMUSCULAR | Status: DC | PRN
  Administered 2012-03-12: 5 mg via INTRAVENOUS

## 2012-03-12 MED ORDER — SIMVASTATIN 20 MG PO TABS
20.0000 mg | ORAL_TABLET | Freq: Every day | ORAL | Status: DC
  Administered 2012-03-12: 20 mg via ORAL
  Filled 2012-03-12 (×2): qty 1

## 2012-03-12 MED ORDER — SENNOSIDES-DOCUSATE SODIUM 8.6-50 MG PO TABS: 1.0000 | ORAL_TABLET | Freq: Every evening | ORAL | Status: DC | PRN

## 2012-03-12 MED ORDER — MENTHOL 3 MG MT LOZG
1.0000 | LOZENGE | OROMUCOSAL | Status: DC | PRN
  Administered 2012-03-13: 3 mg via ORAL
  Filled 2012-03-12: qty 9

## 2012-03-12 MED ORDER — GLYCOPYRROLATE 0.2 MG/ML IJ SOLN
INTRAMUSCULAR | Status: DC | PRN
  Administered 2012-03-12: .6 mg via INTRAVENOUS

## 2012-03-12 MED ORDER — ONDANSETRON HCL 4 MG/2ML IJ SOLN
4.0000 mg | INTRAMUSCULAR | Status: DC | PRN
  Administered 2012-03-12: 4 mg via INTRAVENOUS
  Filled 2012-03-12: qty 2

## 2012-03-12 MED ORDER — SODIUM CHLORIDE 0.9 % IJ SOLN
3.0000 mL | Freq: Two times a day (BID) | INTRAMUSCULAR | Status: DC
  Administered 2012-03-13: 3 mL via INTRAVENOUS

## 2012-03-12 MED ORDER — ROCURONIUM BROMIDE 100 MG/10ML IV SOLN
INTRAVENOUS | Status: DC | PRN
  Administered 2012-03-12: 5 mg via INTRAVENOUS
  Administered 2012-03-12: 50 mg via INTRAVENOUS

## 2012-03-12 MED ORDER — HYDROCODONE-ACETAMINOPHEN 5-325 MG PO TABS
1.0000 | ORAL_TABLET | ORAL | Status: DC | PRN
  Administered 2012-03-13 (×2): 1 via ORAL
  Filled 2012-03-12 (×2): qty 1

## 2012-03-12 MED ORDER — FOLIC ACID 1 MG PO TABS: 1.0000 mg | ORAL_TABLET | ORAL | Status: DC

## 2012-03-12 MED ORDER — CEFAZOLIN SODIUM-DEXTROSE 2-3 GM-% IV SOLR
INTRAVENOUS | Status: AC
  Filled 2012-03-12: qty 50

## 2012-03-12 SURGICAL SUPPLY — 55 items
ADH SKN CLS APL DERMABOND .7 (GAUZE/BANDAGES/DRESSINGS)
APL SKNCLS STERI-STRIP NONHPOA (GAUZE/BANDAGES/DRESSINGS) ×1
BENZOIN TINCTURE PRP APPL 2/3 (GAUZE/BANDAGES/DRESSINGS) ×2 IMPLANT
BUR ROUND FLUTED 4 SOFT TCH (BURR) IMPLANT
CLOTH BEACON ORANGE TIMEOUT ST (SAFETY) ×2 IMPLANT
CORDS BIPOLAR (ELECTRODE) ×2 IMPLANT
COVER SURGICAL LIGHT HANDLE (MISCELLANEOUS) ×2 IMPLANT
DERMABOND ADVANCED (GAUZE/BANDAGES/DRESSINGS)
DERMABOND ADVANCED .7 DNX12 (GAUZE/BANDAGES/DRESSINGS) IMPLANT
DRAPE MICROSCOPE LEICA (MISCELLANEOUS) ×2 IMPLANT
DRAPE PROXIMA HALF (DRAPES) ×4 IMPLANT
DRSG EMULSION OIL 3X3 NADH (GAUZE/BANDAGES/DRESSINGS) ×2 IMPLANT
DRSG MEPILEX BORDER 4X8 (GAUZE/BANDAGES/DRESSINGS) ×1 IMPLANT
DURAPREP 26ML APPLICATOR (WOUND CARE) ×2 IMPLANT
ELECT REM PT RETURN 9FT ADLT (ELECTROSURGICAL) ×2
ELECTRODE REM PT RTRN 9FT ADLT (ELECTROSURGICAL) ×1 IMPLANT
GLOVE BIO SURGEON STRL SZ 6.5 (GLOVE) ×1 IMPLANT
GLOVE BIOGEL PI IND STRL 6.5 (GLOVE) IMPLANT
GLOVE BIOGEL PI IND STRL 7.5 (GLOVE) ×1 IMPLANT
GLOVE BIOGEL PI IND STRL 8 (GLOVE) ×1 IMPLANT
GLOVE BIOGEL PI INDICATOR 6.5 (GLOVE) ×1
GLOVE BIOGEL PI INDICATOR 7.5 (GLOVE) ×1
GLOVE BIOGEL PI INDICATOR 8 (GLOVE) ×1
GLOVE ECLIPSE 7.0 STRL STRAW (GLOVE) ×2 IMPLANT
GLOVE ORTHO TXT STRL SZ7.5 (GLOVE) ×2 IMPLANT
GOWN PREVENTION PLUS LG XLONG (DISPOSABLE) IMPLANT
GOWN STRL NON-REIN LRG LVL3 (GOWN DISPOSABLE) ×6 IMPLANT
KIT BASIN OR (CUSTOM PROCEDURE TRAY) ×2 IMPLANT
KIT ROOM TURNOVER OR (KITS) ×2 IMPLANT
MANIFOLD NEPTUNE II (INSTRUMENTS) ×2 IMPLANT
NDL HYPO 25GX1X1/2 BEV (NEEDLE) ×1 IMPLANT
NDL SPNL 18GX3.5 QUINCKE PK (NEEDLE) ×1 IMPLANT
NDL SUT .5 MAYO 1.404X.05X (NEEDLE) ×1 IMPLANT
NEEDLE HYPO 25GX1X1/2 BEV (NEEDLE) ×2 IMPLANT
NEEDLE MAYO TAPER (NEEDLE) ×2
NEEDLE SPNL 18GX3.5 QUINCKE PK (NEEDLE) ×4 IMPLANT
NS IRRIG 1000ML POUR BTL (IV SOLUTION) ×2 IMPLANT
PACK LAMINECTOMY ORTHO (CUSTOM PROCEDURE TRAY) ×2 IMPLANT
PAD ARMBOARD 7.5X6 YLW CONV (MISCELLANEOUS) ×4 IMPLANT
PATTIES SURGICAL .5 X.5 (GAUZE/BANDAGES/DRESSINGS) ×2 IMPLANT
PATTIES SURGICAL .75X.75 (GAUZE/BANDAGES/DRESSINGS) IMPLANT
SPONGE GAUZE 4X4 12PLY (GAUZE/BANDAGES/DRESSINGS) ×2 IMPLANT
STRIP CLOSURE SKIN 1/2X4 (GAUZE/BANDAGES/DRESSINGS) ×1 IMPLANT
SUT VIC AB 0 CT1 27 (SUTURE) ×2
SUT VIC AB 0 CT1 27XBRD ANBCTR (SUTURE) IMPLANT
SUT VIC AB 2-0 CT1 27 (SUTURE) ×2
SUT VIC AB 2-0 CT1 TAPERPNT 27 (SUTURE) ×1 IMPLANT
SUT VICRYL 0 TIES 12 18 (SUTURE) ×2 IMPLANT
SUT VICRYL 4-0 PS2 18IN ABS (SUTURE) IMPLANT
SUT VICRYL AB 2 0 TIES (SUTURE) ×2 IMPLANT
SYR 20ML ECCENTRIC (SYRINGE) IMPLANT
SYR CONTROL 10ML LL (SYRINGE) ×2 IMPLANT
TOWEL OR 17X24 6PK STRL BLUE (TOWEL DISPOSABLE) ×2 IMPLANT
TOWEL OR 17X26 10 PK STRL BLUE (TOWEL DISPOSABLE) ×2 IMPLANT
WATER STERILE IRR 1000ML POUR (IV SOLUTION) ×2 IMPLANT

## 2012-03-12 NOTE — OR Nursing (Signed)
On assessment in holding area, patient noted "throbbing" pain in back, "mostly have" pain in left leg, and "now having" pain in right leg.

## 2012-03-12 NOTE — Brief Op Note (Cosign Needed)
03/12/2012  2:35 PM  PATIENT:  Cristina Barton  76 y.o. female  PRE-OPERATIVE DIAGNOSIS:  L3-4, L4-5 stenosis  POST-OPERATIVE DIAGNOSIS:  L3-4, L4-5 stenosis  PROCEDURE:  Procedure(s) (LRB): MICRODISCECTOMY LUMBAR LAMINECTOMY (N/A) Central decompression L3-4 and L4-5  SURGEON:  Surgeon(s) and Role:    * Eldred Manges, MD - Primary  PHYSICIAN ASSISTANT: Maud Deed [facility]  ASSISTANTS: none   ANESTHESIA:   general  EBL:  Total I/O In: 1000 [I.V.:1000] Out: -   BLOOD ADMINISTERED:none  DRAINS: none   LOCAL MEDICATIONS USED:  MARCAINE     SPECIMEN:  No Specimen  DISPOSITION OF SPECIMEN:  N/A  COUNTS:  YES  TOURNIQUET:  * No tourniquets in log *  DICTATION: .Note written in EPIC  PLAN OF CARE: Admit for overnight observation  PATIENT DISPOSITION:  PACU - hemodynamically stable.   Delay start of Pharmacological VTE agent (>24hrs) due to surgical blood loss or risk of bleeding: yes

## 2012-03-12 NOTE — Anesthesia Preprocedure Evaluation (Addendum)
Anesthesia Evaluation  Patient identified by MRN, date of birth, ID band Patient awake    Reviewed: Allergy & Precautions, H&P , NPO status , Patient's Chart, lab work & pertinent test results  History of Anesthesia Complications Negative for: history of anesthetic complications  Airway  TM Distance: >3 FB     Dental  (+) Teeth Intact and Dental Advisory Given   Pulmonary neg pulmonary ROS,  breath sounds clear to auscultation  Pulmonary exam normal       Cardiovascular hypertension, Pt. on medications Rhythm:Regular Rate:Normal     Neuro/Psych Weakness numbness bilateral lower extremities, L worse than R negative psych ROS   GI/Hepatic negative GI ROS, Neg liver ROS,   Endo/Other  negative endocrine ROS  Renal/GU negative Renal ROS     Musculoskeletal   Abdominal (+)  Abdomen: soft.    Peds  Hematology negative hematology ROS (+)   Anesthesia Other Findings   Reproductive/Obstetrics                          Anesthesia Physical Anesthesia Plan  ASA: II  Anesthesia Plan: General   Post-op Pain Management:    Induction: Intravenous  Airway Management Planned: Oral ETT  Additional Equipment:   Intra-op Plan:   Post-operative Plan: Extubation in OR  Informed Consent: I have reviewed the patients History and Physical, chart, labs and discussed the procedure including the risks, benefits and alternatives for the proposed anesthesia with the patient or authorized representative who has indicated his/her understanding and acceptance.   Dental advisory given  Plan Discussed with: CRNA, Anesthesiologist and Surgeon  Anesthesia Plan Comments:        Anesthesia Quick Evaluation

## 2012-03-12 NOTE — Interval H&P Note (Signed)
History and Physical Interval Note:  03/12/2012 12:33 PM  [name]  has presented today for surgery, with the diagnosis of L3-4, L4-5 stenosis  The various methods of treatment have been discussed with the patient and family. After consideration of risks, benefits and other options for treatment, the patient has consented to  Procedure(s) (LRB): MICRODISCECTOMY LUMBAR LAMINECTOMY (N/A) as a surgical intervention .  The patient's history has been reviewed, patient examined, no change in status, stable for surgery.  I have reviewed the patient's chart and labs.  Questions were answered to the patient's satisfaction.     Su Duma C

## 2012-03-12 NOTE — Interval H&P Note (Signed)
History and Physical Interval Note:  03/12/2012 12:34 PM  [name]  has presented today for surgery, with the diagnosis of L3-4, L4-5 stenosis  The various methods of treatment have been discussed with the patient and family. After consideration of risks, benefits and other options for treatment, the patient has consented to  Procedure(s) (LRB): MICRODISCECTOMY LUMBAR LAMINECTOMY (N/A) as a surgical intervention .  The patient's history has been reviewed, patient examined, no change in status, stable for surgery.  I have reviewed the patient's chart and labs.  Questions were answered to the patient's satisfaction.     Cristina Barton C

## 2012-03-12 NOTE — Anesthesia Postprocedure Evaluation (Signed)
  Anesthesia Post-op Note  Patient: Cristina Barton  Procedure(s) Performed: Procedure(s) (LRB): MICRODISCECTOMY LUMBAR LAMINECTOMY (N/A)  Patient Location: PACU  Anesthesia Type: General  Level of Consciousness: awake, alert  and oriented  Airway and Oxygen Therapy: Patient Spontanous Breathing  Post-op Pain: mild  Post-op Assessment: Post-op Vital signs reviewed, Patient's Cardiovascular Status Stable, Respiratory Function Stable, Patent Airway, No signs of Nausea or vomiting and Pain level controlled  Post-op Vital Signs: Reviewed and stable  Complications: No apparent anesthesia complications

## 2012-03-12 NOTE — Transfer of Care (Signed)
Immediate Anesthesia Transfer of Care Note  Patient: Cristina Barton  Procedure(s) Performed: Procedure(s) (LRB): MICRODISCECTOMY LUMBAR LAMINECTOMY (N/A)  Patient Location: PACU  Anesthesia Type: General  Level of Consciousness: awake and sedated  Airway & Oxygen Therapy: Patient Spontanous Breathing, Patient connected to nasal cannula oxygen and Patient connected to face mask oxygen  Post-op Assessment: Report given to PACU RN, Post -op Vital signs reviewed and stable, Patient moving all extremities and Patient moving all extremities X 4  Post vital signs: Reviewed and stable  Complications: No apparent anesthesia complications

## 2012-03-12 NOTE — H&P (Signed)
Cristina Barton is an 76 y.o. female.   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  She has been treated successfully with ESIs until recently.  The last ESI provided only a few days of relief of her symptoms.  She has developed weakness of the left quad and has fallen because of it.  Pt has also used oral steroids and is on Methotrexate for psoriasis. No bowel or bladder dysfunction.  Numbness and tingling with progressive ambulation only.  MRI findings indicate severe multifactorial spinal and foraminal stenosis at L3-4 and L4-5 left greater than right side.  She wishes to proceed with surgical intervention of decompression of L3-4 and L4-5. Pts Methotrexate was stopped 2 weeks pre op.  Past Medical History  Diagnosis Date  . Hypertension     takes Verapamil nightly  . Hyperlipidemia     takes Pravastatin nightly  . Arthritis   . Joint pain   . Chronic back pain     buldging disc  . Psoriasis     methotrexate on Mondays-last dose 02/26/12  . Early cataracts, bilateral     Past Surgical History  Procedure Date  . Tonsillectomy     as a child  . Colonoscopy     History reviewed. No pertinent family history. Social History:  reports that she has never smoked. She does not have any smokeless tobacco history on file. She reports that she does not drink alcohol or use illicit drugs.  Allergies:  Allergies  Allergen Reactions  . Sulfa Antibiotics Rash    Medications Prior to Admission  Medication Sig Dispense Refill  . Calcium Carbonate-Vitamin D (CALCIUM 600 + D PO) Take 1 tablet by mouth daily.      . folic acid (FOLVITE) 1 MG tablet Take 1 mg by mouth See admin instructions. Every day except Monday      . methotrexate (RHEUMATREX) 2.5 MG tablet Take 5 mg by mouth once a week. Mondays. Caution:Chemotherapy. Protect from light.      .  pravastatin (PRAVACHOL) 40 MG tablet Take 40 mg by mouth at bedtime.      . verapamil (VERELAN PM) 240 MG 24 hr capsule Take 240 mg by mouth at bedtime.        Results for orders placed during the hospital encounter of 03/12/12 (from the past 48 hour(s))  APTT     Status: Normal   Collection Time   03/12/12 10:21 AM      Component Value Range Comment   aPTT 28  24 - 37 seconds   PROTIME-INR     Status: Normal   Collection Time   03/12/12 10:21 AM      Component Value Range Comment   Prothrombin Time 14.2  11.6 - 15.2 seconds    INR 1.08  0.00 - 1.49    Dg Chest 2 View  03/11/2012  *RADIOLOGY REPORT*  Clinical Data: Microdiskectomy and lumbar laminectomy.  CHEST - 2 VIEW  Comparison: No priors.  Findings: Lung volumes are low.  No consolidative airspace disease. No pleural effusions.  No pneumothorax.  No pulmonary nodule or mass noted.  Pulmonary vasculature and the cardiomediastinal silhouette are within normal limits.  IMPRESSION: 1. No radiographic evidence of acute cardiopulmonary disease.  Original Report Authenticated By: Florencia Reasons, M.D.    Review of Systems  Constitutional: Negative.   HENT: Negative.  Eyes: Negative.   Respiratory: Negative.   Cardiovascular: Negative.   Gastrointestinal: Negative.   Genitourinary: Negative.   Musculoskeletal: Positive for falls.       Weakness in legs with standing and walking.  Pain in knees because of arthritis  Skin: Negative.   Neurological: Positive for focal weakness.  Endo/Heme/Allergies: Negative.   Psychiatric/Behavioral: Negative.     Blood pressure 145/61, pulse 62, temperature 97.6 F (36.4 C), temperature source Oral, resp. rate 18, SpO2 95.00%. Physical Exam  Constitutional: She is oriented to person, place, and time. She appears well-developed and well-nourished.  HENT:  Head: Normocephalic and atraumatic.  Eyes: EOM are normal. Pupils are equal, round, and reactive to light.  Neck: Normal range of motion. Neck  supple.  Cardiovascular: Normal rate, regular rhythm and intact distal pulses.   Pulses:      Dorsalis pedis pulses are 1+ on the right side, and 1+ on the left side.  Respiratory: Effort normal.  GI: Soft. There is no tenderness.  Musculoskeletal: She exhibits no edema.       Left quad weakness otherwise no focal weakness. -SLR bilateral. Painful ROM of right knee primarily in the medial compartment   Neurological: She is alert and oriented to person, place, and time.  Skin: Skin is warm and dry.  Psychiatric: She has a normal mood and affect.     Assessment/Plan Severe multifactorial spinal and foraminal stenosis L3-4 and L4-5 left >right  PLAN:  Decompression of L3-4 and L4-5  Tarini Carrier M 03/12/2012, 11:11 AM

## 2012-03-13 MED ORDER — PANTOPRAZOLE SODIUM 40 MG PO TBEC
40.0000 mg | DELAYED_RELEASE_TABLET | Freq: Every day | ORAL | Status: DC
  Administered 2012-03-13: 40 mg via ORAL
  Filled 2012-03-13: qty 1

## 2012-03-13 MED ORDER — METHOCARBAMOL 500 MG PO TABS: 500.0000 mg | ORAL_TABLET | Freq: Three times a day (TID) | ORAL | Status: AC | PRN

## 2012-03-13 MED ORDER — PRAVASTATIN SODIUM 40 MG PO TABS
40.0000 mg | ORAL_TABLET | Freq: Every day | ORAL | Status: DC
  Filled 2012-03-13: qty 1

## 2012-03-13 MED ORDER — HYDROCODONE-ACETAMINOPHEN 5-325 MG PO TABS: 1.0000 | ORAL_TABLET | ORAL | Status: AC | PRN

## 2012-03-13 NOTE — Evaluation (Signed)
Physical Therapy Evaluation Patient Details Name: LODIE WAHEED MRN: [id number] DOB: [birth year] Today's Date: 03/13/2012 Time: 4098-1191 PT Time Calculation (min): 37 min  PT Assessment / Plan / Recommendation Clinical Impression  Pt is s/p microdiscectomey and lumber laminectomy. Pt presents with some dependencies in mobility, but will have the assistance from her sister at home. Pt will need some reinforcement of back precautions and bed mobility. I issued a back precautions handout. Pt is cleared to D/C home today. Recommend HHPT follow-up and RW for improved safety.    PT Assessment  All further PT needs can be met in the next venue of care    Follow Up Recommendations  Home health PT    Barriers to Discharge        Equipment Recommendations  Rolling walker with 5" wheels    Recommendations for Other Services     Frequency      Precautions / Restrictions Precautions Precautions: Back Precaution Comments: reviewed 3/3 back precautions and issued a back handout Restrictions Weight Bearing Restrictions: No   Pertinent Vitals/Pain       Mobility  Bed Mobility Bed Mobility: Rolling Left Rolling Left: 5: Supervision Details for Bed Mobility Assistance: cues for technique Transfers Transfers: Sit to Stand Sit to Stand: 5: Supervision;From chair/3-in-1;Other (comment) (pt was min assist to stand from lower surfaces) Ambulation/Gait Ambulation/Gait Assistance: 6: Modified independent (Device/Increase time) Assistive device: Rolling walker Gait Pattern: Decreased stride length Gait velocity: decreased Stairs: Yes Stairs Assistance: 5: Supervision Stairs Assistance Details (indicate cue type and reason): cues for technique Stair Management Technique: No rails;Backwards Number of Stairs: 1     Exercises     PT Diagnosis:    PT Problem List: Decreased activity tolerance;Decreased knowledge of precautions PT Treatment Interventions:     PT Goals    Visit  Information  Last PT Received On: 03/13/12 Assistance Needed: +1    Subjective Data  Subjective: I will have help at home and would like to go home. Patient Stated Goal: To walk   Prior Functioning  Home Living Lives With: Alone Available Help at Discharge: Family (sister will be there 24 hours) Type of Home: House Home Access: Stairs to enter Secretary/administrator of Steps: 1 Entrance Stairs-Rails: None Home Layout: One level Home Adaptive Equipment: None Prior Function Level of Independence: Independent Able to Take Stairs?: Yes Driving: Yes Vocation: Retired Musician: No difficulties    Cognition  Overall Cognitive Status: Appears within functional limits for tasks assessed/performed Arousal/Alertness: Awake/alert Orientation Level: Appears intact for tasks assessed Behavior During Session: [facility] for tasks performed    Extremity/Trunk Assessment Right Lower Extremity Assessment RLE ROM/Strength/Tone: [facility] for tasks assessed Left Lower Extremity Assessment LLE ROM/Strength/Tone: [facility] for tasks assessed   Balance Balance Balance Assessed: Yes Static Standing Balance Static Standing - Balance Support: Bilateral upper extremity supported Static Standing - Level of Assistance: 6: Modified independent (Device/Increase time)  End of Session    GP Functional Assessment Tool Used: clinical judgement Functional Limitation: Mobility: Walking and moving around Mobility: Walking and Moving Around Current Status (Y7829): At least 1 percent but less than 20 percent impaired, limited or restricted Mobility: Walking and Moving Around Goal Status [phone]): At least 1 percent but less than 20 percent impaired, limited or restricted Mobility: Walking and Moving Around Discharge Status [phone]): At least 1 percent but less than 20 percent impaired, limited or restricted   Greggory Stallion 03/13/2012, 1:49 PM

## 2012-03-13 NOTE — Progress Notes (Signed)
Subjective: 1 Day Post-Op Procedure(s) (LRB): MICRODISCECTOMY LUMBAR LAMINECTOMY (N/A) Patient reports pain as mild.  Mostly back pain, minimal leg pain.  Getting up to chair this am.  Has some help at home from her sister.   PT to see today.  No nausea. Taking a regular diet.  Voiding .  Min flatus.  Objective: Vital signs in last 24 hours: Temp:  [97.4 F (36.3 C)-98.7 F (37.1 C)] 98.6 F (37 C) (08/08 0231) Pulse Rate:  [58-80] 70  (08/08 0231) Resp:  [9-18] 18  (08/08 0231) BP: (118-168)/(61-88) 158/72 mmHg (08/08 0231) SpO2:  [92 %-100 %] 97 % (08/08 0231)  Intake/Output from previous day: 08/07 0701 - 08/08 0700 In: 1800 [I.V.:1750; IV Piggyback:50] Out: -  Intake/Output this shift:     Basename 03/11/12 0926  HGB 15.2*    Basename 03/11/12 0926  WBC 7.2  RBC 5.08  HCT 44.6  PLT 178    Basename 03/11/12 0926  NA 141  K 4.0  CL 104  CO2 29  BUN 13  CREATININE 0.69  GLUCOSE 97  CALCIUM 9.7    Basename 03/12/12 1021  LABPT --  INR 1.08    Neurovascular intact Sensation intact distally Intact pulses distally Dorsiflexion/Plantar flexion intact Incision: scant drainage  Assessment/Plan: 1 Day Post-Op Procedure(s) (LRB): MICRODISCECTOMY LUMBAR LAMINECTOMY (N/A) Up with therapy Plan for discharge tomorrow if does well with PT Pt to eval for back precautions and posture instructions.  Also any DME or safety issues at home. Try to minimize narcotics. Dressing change daily or prn  Brexlee Heberlein M 03/13/2012, 8:51 AM

## 2012-03-13 NOTE — Progress Notes (Signed)
Patient ID: Cristina Barton, female   DOB: 01/15/1936, 76 y.o.   MRN: [id number] Pt ambulated in hallway and is having no pain.  She would like to go home today and is stable to do so later today after the PT consult.  Her son will help her with getting home and she has other family to help at home.

## 2012-03-13 NOTE — Progress Notes (Signed)
UR COMPLETED  

## 2012-03-13 NOTE — Op Note (Signed)
NAMECORNELIUS, SCHUITEMA NO.:  [id number]  MEDICAL RECORD NO.:  [id number]  LOCATION:  5N29C                        FACILITY:  [facility]  PHYSICIAN:  Mark C. Ophelia Charter, M.D.    DATE OF BIRTH:  27-Jul-1936  DATE OF PROCEDURE:  03/12/2012 DATE OF DISCHARGE:                              OPERATIVE REPORT   PREOPERATIVE DIAGNOSIS:  Lumbar stenosis, severe L3-4, L4-5 with neurogenic claudication.  POSTOPERATIVE DIAGNOSIS:  Lumbar stenosis, severe L3-4, L4-5 with neurogenic claudication.  PROCEDURE:  L3-4, L4-5 decompression, foraminotomy, microscope assisted.  SURGEON:  Mark C. Ophelia Charter, M.D.  ASSISTANT:  Wende Neighbors, P.A. medically necessary and present for that entire procedure.  ANESTHESIA:  GOT.  EBL:  100 mL.  DRAINS:  None.  COMPLICATIONS:  None.  INDICATIONS:  This patient has had progressive neurogenic claudication symptoms with decreased ambulation ability and decreased ability to stand.  MRI scan showed severe multifactorial stenosis at 3-4 with grade 1 anterolisthesis, which only shows 1.5 mm on flexion/extension views and moderately severe stenosis at 4-5 level.  The patient had bilateral weakness in the quads and has failed conservative treatment including steroids.  PROCEDURE:  After induction of general anesthesia, the patient was placed prone on chest rolls.  A 2 g of Ancef was given prophylactically. Back was prepped with DuraPrep.  Shoulder stretched 90/90 with anterior shoulder pads and elbow pads underneath the forearm.  Area was squared with towels.  Betadine and Steri-Drape applied.  Laminectomy sheet. Time-out procedure completed.  Needle localization placed at the expected 3-4 and 4-5 level and cross-table lateral x-ray showed that the upper needle was at 3-4 but the bottom level the needle was just above the level of the disk space.  Adjustments were made, incision was made. Subperiosteal dissection on the lamina was performed.  The  spinous process and lamina was removed, was thinned down with Kerrison.  Bur was used to thin down the lamina further and then the operative microscope was draped, brought in, and anterior aspect of the lamina was removed exposing thick hypertrophic ligamentum.  Chunks were removed, peeled directly off the dura, and removed laterally.  Foraminotomies were performed to grab the nerve root.  The stenosis at the L3-4 level was a very tight, short level exactly corresponding at the level of the disk and as the top of the disk was encountered, there was end of the ligamentum and the dura was well decompressed in round after that. Chunks of ligament was causing stenosis once they were decompressed. Gutters were followed down removing bone up to the level of the pedicle. Identical procedure was repeated at 4-5 level starting inferior coming proximally and then x-ray was taken with Irven [place] on 4-5 disk, and then #4 [place] on 3-4 disk.  X-ray was being taken, however, the battery and the cassette failed and when the cassette was pulled off the patient, another one __________ was pushed up against the patient, the Freer elevator at the top slid off the disk and was slightly inferior to the disk.  An x-ray was visualized, this was directly noted then with direct visualization with microscope again was noted that the #4 had slid down  below the level of the disk.  Adequate decompression has been performed as planned at both levels.  Gutters were run 1 final time. Hemostasis was obtained.  Irrigation and standard layered closure and 0- Vicryl in the deep fascia, 2-0 Vicryl subcutaneous tissue, subcuticular closure, skin closure, postop dressing.  Time-out procedure and needle count was correct at the end of the case.     Mark C. Ophelia Charter, M.D.     MCY/MEDQ  D:  03/12/2012  T:  03/13/2012  Job:  [id number]

## 2012-03-14 ENCOUNTER — Encounter (HOSPITAL_COMMUNITY): Payer: Self-pay | Admitting: Orthopaedic Surgery

## 2012-03-14 NOTE — Progress Notes (Addendum)
CARE MANAGEMENT NOTE 03/14/2012  Patient:  Cristina Barton, Cristina Barton   Account Number:  [id number]  Date Initiated:  03/13/2012  Documentation initiated by:  Vance Peper  Subjective/Objective Assessment:   76 yr old female s/p L3-4, 4-5 decompression, foraminotomy     Action/Plan:   Anticipated DC Date:  03/13/2012   Anticipated DC Plan:  HOME with Home Health    DC Planning Services  CM consult      [facility] Choice  DURABLE MEDICAL EQUIPMENT   Choice offered to / List presented to:     DME arranged  Levan Hurst      DME agency  [facility]     [facility] arranged  PT      [facility] agency  [facility]   Status of service:  Completed, signed off Medicare Important Message given?   (If response is "NO", the following Medicare IM given date fields will be blank) Date Medicare IM given:   Date Additional Medicare IM given:    Discharge Disposition:  HOME WITH HOME HEALTH Per UR Regulation:    If discussed at Long Length of Stay Meetings, dates discussed:    Comments:

## 2012-03-14 NOTE — Discharge Summary (Signed)
Physician Discharge Summary  Patient ID: Cristina Barton MRN: [id number] DOB/AGE: Jul 12, 1936 76 y.o.  Admit date: 03/12/2012 Discharge date: 03/14/2012  Admission Diagnoses:  Spinal stenosis, lumbar region, with neurogenic claudication L3-4 and L4-5  Discharge Diagnoses:  Principal Problem:  *Spinal stenosis, lumbar region, with neurogenic claudication L3-4 and L4-5  Past Medical History  Diagnosis Date  . Hypertension     takes Verapamil nightly  . Hyperlipidemia     takes Pravastatin nightly  . Joint pain   . Chronic back pain     buldging disc  . Psoriasis     methotrexate on Mondays-last dose 02/26/12  . Early cataracts, bilateral   . Arthritis     "back"    Surgeries: Procedure(s): MICRODISCECTOMY LUMBAR LAMINECTOMY on 03/12/2012 Central decompression L3-4 and L4-5  Consultants (if any):  none  Discharged Condition: Improved  Hospital Course: Cristina Barton is an 76 y.o. female who was admitted 03/12/2012 with a diagnosis of Spinal stenosis, lumbar region, with neurogenic claudication and went to the operating room on 03/12/2012 and underwent the above named procedures.    She was given perioperative antibiotics:  Anti-infectives     Start     Dose/Rate Route Frequency Ordered Stop   03/12/12 1645   ceFAZolin (ANCEF) IVPB 1 g/50 mL premix        1 g 100 mL/hr over 30 Minutes Intravenous Every 8 hours 03/12/12 1637 03/13/12 0119   03/11/12 1502   ceFAZolin (ANCEF) IVPB 2 g/50 mL premix        2 g 100 mL/hr over 30 Minutes Intravenous 60 min pre-op 03/11/12 1502 03/12/12 1255        .  She was given sequential compression devices, early ambulation She benefited maximally from the hospital stay and there were no complications.    Recent vital signs:  Filed Vitals:   03/13/12 0231  BP: 158/72  Pulse: 70  Temp: 98.6 F (37 C)  Resp: 18    Recent laboratory studies:  Lab Results  Component Value Date   HGB 15.2* 03/11/2012   Lab Results  Component Value  Date   WBC 7.2 03/11/2012   PLT 178 03/11/2012   Lab Results  Component Value Date   INR 1.08 03/12/2012   Lab Results  Component Value Date   NA 141 03/11/2012   K 4.0 03/11/2012   CL 104 03/11/2012   CO2 29 03/11/2012   BUN 13 03/11/2012   CREATININE 0.69 03/11/2012   GLUCOSE 97 03/11/2012    Discharge Medications:   Medication List  As of 03/14/2012 11:13 AM   TAKE these medications         CALCIUM 600 + D PO   Take 1 tablet by mouth daily.      folic acid 1 MG tablet   Commonly known as: FOLVITE   Take 1 mg by mouth See admin instructions. Every day except Monday      HYDROcodone-acetaminophen 5-325 MG per tablet   Commonly known as: NORCO/VICODIN   Take 1-2 tablets by mouth every 4 (four) hours as needed for pain.      methocarbamol 500 MG tablet   Commonly known as: ROBAXIN   Take 1 tablet (500 mg total) by mouth every 8 (eight) hours as needed (for muscle spasm).      methotrexate 2.5 MG tablet   Commonly known as: RHEUMATREX   Take 5 mg by mouth once a week. Mondays. Caution:Chemotherapy. Protect from light.  pravastatin 40 MG tablet   Commonly known as: PRAVACHOL   Take 40 mg by mouth at bedtime.      verapamil 240 MG 24 hr capsule   Commonly known as: VERELAN PM   Take 240 mg by mouth at bedtime.            Diagnostic Studies: Dg Chest 2 View  03/11/2012  *RADIOLOGY REPORT*  Clinical Data: Microdiskectomy and lumbar laminectomy.  CHEST - 2 VIEW  Comparison: No priors.  Findings: Lung volumes are low.  No consolidative airspace disease. No pleural effusions.  No pneumothorax.  No pulmonary nodule or mass noted.  Pulmonary vasculature and the cardiomediastinal silhouette are within normal limits.  IMPRESSION: 1. No radiographic evidence of acute cardiopulmonary disease.  Original Report Authenticated By: Florencia Reasons, M.D.   Dg Lumbar Spine 2-3 Views  03/12/2012  *RADIOLOGY REPORT*  Clinical Data: Spinal stenosis.  LUMBAR SPINE - 2-3 VIEW  Comparison: The lumbar  MRI dated 03/03/2012  Findings: Image #1 demonstrates needles at the level of the lamina of L3 and L4.  Radiograph #2 shows an instrument at the L4-5 level and an instrument at the L3-4 level.  IMPRESSION: Instruments at L3-4 and L4-5.  Original Report Authenticated By: Gwynn Burly, M.D.    Disposition: 01-Home or Self Care  Discharge Orders    Future Orders Please Complete By Expires   Diet - low sodium heart healthy      Call MD / Call 911      Comments:   If you experience chest pain or shortness of breath, CALL 911 and be transported to the hospital emergency room.  If you develope a fever above 101 F, pus (white drainage) or increased drainage or redness at the wound, or calf pain, call your surgeon's office.   Constipation Prevention      Comments:   Drink plenty of fluids.  Prune juice may be helpful.  You may use a stool softener, such as Colace (over the counter) 100 mg twice a day.  Use MiraLax (over the counter) for constipation as needed.   Increase activity slowly as tolerated      Discharge instructions      Comments:   Walk as tolerated.  Change dressing daily or as needed.  No bending, lifting or twisting.  Ice packs to back as needed.  May shower in 5 days if no drainage from wound.  Keep wound covered.   Driving restrictions      Comments:   No driving   Lifting restrictions      Comments:   No lifting      Follow-up Information    Follow up with YATES,MARK C, MD. Schedule an appointment as soon as possible for a visit in 2 weeks.   Contact information:   [facility] [address] [place] [address] [phone]           Signed: Wende Neighbors 03/14/2012, 11:13 AM

## 2012-03-17 DIAGNOSIS — IMO0001 Reserved for inherently not codable concepts without codable children: Secondary | ICD-10-CM | POA: Diagnosis not present

## 2012-03-17 DIAGNOSIS — I10 Essential (primary) hypertension: Secondary | ICD-10-CM | POA: Diagnosis not present

## 2012-03-17 DIAGNOSIS — Z4789 Encounter for other orthopedic aftercare: Secondary | ICD-10-CM | POA: Diagnosis not present

## 2012-03-17 DIAGNOSIS — M159 Polyosteoarthritis, unspecified: Secondary | ICD-10-CM | POA: Diagnosis not present

## 2012-03-20 DIAGNOSIS — I10 Essential (primary) hypertension: Secondary | ICD-10-CM | POA: Diagnosis not present

## 2012-03-20 DIAGNOSIS — M159 Polyosteoarthritis, unspecified: Secondary | ICD-10-CM | POA: Diagnosis not present

## 2012-03-20 DIAGNOSIS — Z4789 Encounter for other orthopedic aftercare: Secondary | ICD-10-CM | POA: Diagnosis not present

## 2012-03-20 DIAGNOSIS — IMO0001 Reserved for inherently not codable concepts without codable children: Secondary | ICD-10-CM | POA: Diagnosis not present

## 2012-05-26 ENCOUNTER — Other Ambulatory Visit: Payer: Self-pay | Admitting: Family Medicine

## 2012-05-26 DIAGNOSIS — Z1231 Encounter for screening mammogram for malignant neoplasm of breast: Secondary | ICD-10-CM

## 2012-05-29 ENCOUNTER — Ambulatory Visit
Admission: RE | Admit: 2012-05-29 | Discharge: 2012-05-29 | Disposition: A | Discharge status: Home / Self Care | Payer: Medicare Other | Source: Ambulatory Visit | Attending: Family Medicine | Admitting: Family Medicine

## 2012-05-29 DIAGNOSIS — Z1231 Encounter for screening mammogram for malignant neoplasm of breast: Secondary | ICD-10-CM

## 2012-07-07 DIAGNOSIS — M25569 Pain in unspecified knee: Secondary | ICD-10-CM | POA: Diagnosis not present

## 2012-07-07 DIAGNOSIS — M1710 Unilateral primary osteoarthritis, unspecified knee: Secondary | ICD-10-CM | POA: Diagnosis not present

## 2012-07-14 DIAGNOSIS — M1710 Unilateral primary osteoarthritis, unspecified knee: Secondary | ICD-10-CM | POA: Diagnosis not present

## 2012-07-18 DIAGNOSIS — M25569 Pain in unspecified knee: Secondary | ICD-10-CM | POA: Diagnosis not present

## 2012-07-18 DIAGNOSIS — M1710 Unilateral primary osteoarthritis, unspecified knee: Secondary | ICD-10-CM | POA: Diagnosis not present

## 2012-08-11 DIAGNOSIS — Z23 Encounter for immunization: Secondary | ICD-10-CM | POA: Diagnosis not present

## 2012-08-11 DIAGNOSIS — E7800 Pure hypercholesterolemia, unspecified: Secondary | ICD-10-CM | POA: Diagnosis not present

## 2012-08-11 DIAGNOSIS — I10 Essential (primary) hypertension: Secondary | ICD-10-CM | POA: Diagnosis not present

## 2012-08-11 DIAGNOSIS — R7309 Other abnormal glucose: Secondary | ICD-10-CM | POA: Diagnosis not present

## 2012-08-12 ENCOUNTER — Other Ambulatory Visit (HOSPITAL_COMMUNITY): Payer: Self-pay | Admitting: Orthopaedic Surgery

## 2012-08-26 DIAGNOSIS — Z96659 Presence of unspecified artificial knee joint: Secondary | ICD-10-CM | POA: Diagnosis not present

## 2012-08-26 DIAGNOSIS — M545 Low back pain: Secondary | ICD-10-CM | POA: Diagnosis not present

## 2012-08-26 DIAGNOSIS — I10 Essential (primary) hypertension: Secondary | ICD-10-CM | POA: Diagnosis not present

## 2012-08-26 DIAGNOSIS — E782 Mixed hyperlipidemia: Secondary | ICD-10-CM | POA: Diagnosis not present

## 2012-08-29 NOTE — Pre-Procedure Instructions (Signed)
Cristina Barton  08/29/2012   Your procedure is scheduled on:  Mon, Feb 3 @ 12:30 PM  Report to [facility] at 10:30 AM.  Call this number if you have problems the morning of surgery: [phone]   Remember:   Do not eat food or drink liquids after midnight.      Do not wear jewelry, make-up or nail polish.  Do not wear lotions, powders, or perfumes. You may wear deodorant.  Do not shave 48 hours prior to surgery.   Do not bring valuables to the hospital.  Contacts, dentures or bridgework may not be worn into surgery.  Leave suitcase in the car. After surgery it may be brought to your room.  For patients admitted to the hospital, checkout time is 11:00 AM the day of  discharge.   Patients discharged the day of surgery will not be allowed to drive  home.    Special Instructions: Shower using CHG 2 nights before surgery and the night before surgery.  If you shower the day of surgery use CHG.  Use special wash - you have one bottle of CHG for all showers.  You should use approximately 1/3 of the bottle for each shower.   Please read over the following fact sheets that you were given: Pain Booklet, Coughing and Deep Breathing, Blood Transfusion Information, Total Joint Packet, MRSA Information and Surgical Site Infection Prevention

## 2012-09-01 ENCOUNTER — Encounter (HOSPITAL_COMMUNITY)
Admission: RE | Admit: 2012-09-01 | Discharge: 2012-09-01 | Disposition: A | Discharge status: Home / Self Care | Payer: Medicare Other | Source: Ambulatory Visit | Attending: Orthopaedic Surgery | Admitting: Orthopaedic Surgery

## 2012-09-01 ENCOUNTER — Encounter (HOSPITAL_COMMUNITY): Payer: Self-pay

## 2012-09-01 LAB — CBC
HCT: 43.7 % (ref 36.0–46.0)
Hemoglobin: 14.5 g/dL (ref 12.0–15.0)
MCHC: 33.2 g/dL (ref 30.0–36.0)
RDW: 14.1 % (ref 11.5–15.5)
WBC: 6.3 10*3/uL (ref 4.0–10.5)

## 2012-09-01 LAB — COMPREHENSIVE METABOLIC PANEL
ALT: 14 U/L (ref 0–35)
Albumin: 3.6 g/dL (ref 3.5–5.2)
Alkaline Phosphatase: 116 U/L (ref 39–117)
BUN: 10 mg/dL (ref 6–23)
Chloride: 105 mEq/L (ref 96–112)
Potassium: 3.4 mEq/L — ABNORMAL LOW (ref 3.5–5.1)
Sodium: 142 mEq/L (ref 135–145)
Total Bilirubin: 0.3 mg/dL (ref 0.3–1.2)
Total Protein: 6.8 g/dL (ref 6.0–8.3)

## 2012-09-01 LAB — URINALYSIS, ROUTINE W REFLEX MICROSCOPIC
Bilirubin Urine: NEGATIVE
Glucose, UA: NEGATIVE mg/dL
Hgb urine dipstick: NEGATIVE
Ketones, ur: NEGATIVE mg/dL
Protein, ur: NEGATIVE mg/dL
pH: 5 (ref 5.0–8.0)

## 2012-09-01 LAB — TYPE AND SCREEN
ABO/RH(D): B POS
Antibody Screen: NEGATIVE

## 2012-09-01 LAB — PROTIME-INR
INR: 1.04 (ref 0.00–1.49)
Prothrombin Time: 13.5 s (ref 11.6–15.2)

## 2012-09-01 LAB — SURGICAL PCR SCREEN
MRSA, PCR: NEGATIVE
Staphylococcus aureus: NEGATIVE

## 2012-09-01 LAB — APTT: aPTT: 28 seconds (ref 24–37)

## 2012-09-01 LAB — ABO/RH: ABO/RH(D): B POS

## 2012-09-01 NOTE — Progress Notes (Signed)
Pt doesn't have a cardiologist   Denies ever having an echo/heart cath/stress test  Dr.Hamrick in [place] is Medical MD  EKG and CXR in epic from 03/11/12

## 2012-09-07 MED ORDER — CEFAZOLIN SODIUM-DEXTROSE 2-3 GM-% IV SOLR
2.0000 g | INTRAVENOUS | Status: AC
  Administered 2012-09-08: 2 g via INTRAVENOUS
  Filled 2012-09-07: qty 50

## 2012-09-08 ENCOUNTER — Encounter (HOSPITAL_COMMUNITY): Payer: Self-pay | Admitting: Vascular Surgery

## 2012-09-08 ENCOUNTER — Inpatient Hospital Stay (HOSPITAL_COMMUNITY)
Admission: RE | Admit: 2012-09-08 | Discharge: 2012-09-11 | DRG: 470 | Disposition: A | Discharge status: Skilled Nursing Facility | Payer: Medicare Other | Source: Ambulatory Visit | Attending: Orthopaedic Surgery | Admitting: Orthopaedic Surgery

## 2012-09-08 ENCOUNTER — Encounter (HOSPITAL_COMMUNITY): Payer: Self-pay | Admitting: Anesthesiology

## 2012-09-08 ENCOUNTER — Encounter (HOSPITAL_COMMUNITY): Payer: Self-pay

## 2012-09-08 ENCOUNTER — Inpatient Hospital Stay (HOSPITAL_COMMUNITY): Payer: Medicare Other | Admitting: Anesthesiology

## 2012-09-08 ENCOUNTER — Encounter (HOSPITAL_COMMUNITY)
Admission: RE | Disposition: A | Discharge status: Skilled Nursing Facility | Payer: Self-pay | Source: Ambulatory Visit | Attending: Orthopaedic Surgery

## 2012-09-08 DIAGNOSIS — Z01812 Encounter for preprocedural laboratory examination: Secondary | ICD-10-CM | POA: Diagnosis not present

## 2012-09-08 DIAGNOSIS — I10 Essential (primary) hypertension: Secondary | ICD-10-CM | POA: Diagnosis not present

## 2012-09-08 DIAGNOSIS — M6281 Muscle weakness (generalized): Secondary | ICD-10-CM | POA: Diagnosis not present

## 2012-09-08 DIAGNOSIS — M1990 Unspecified osteoarthritis, unspecified site: Secondary | ICD-10-CM | POA: Diagnosis not present

## 2012-09-08 DIAGNOSIS — M25669 Stiffness of unspecified knee, not elsewhere classified: Secondary | ICD-10-CM | POA: Diagnosis not present

## 2012-09-08 DIAGNOSIS — Z79899 Other long term (current) drug therapy: Secondary | ICD-10-CM

## 2012-09-08 DIAGNOSIS — R269 Unspecified abnormalities of gait and mobility: Secondary | ICD-10-CM | POA: Diagnosis not present

## 2012-09-08 DIAGNOSIS — E785 Hyperlipidemia, unspecified: Secondary | ICD-10-CM | POA: Diagnosis present

## 2012-09-08 DIAGNOSIS — Z471 Aftercare following joint replacement surgery: Secondary | ICD-10-CM | POA: Diagnosis not present

## 2012-09-08 DIAGNOSIS — G8918 Other acute postprocedural pain: Secondary | ICD-10-CM | POA: Diagnosis not present

## 2012-09-08 DIAGNOSIS — M1710 Unilateral primary osteoarthritis, unspecified knee: Secondary | ICD-10-CM | POA: Diagnosis not present

## 2012-09-08 DIAGNOSIS — Z96659 Presence of unspecified artificial knee joint: Secondary | ICD-10-CM | POA: Diagnosis not present

## 2012-09-08 DIAGNOSIS — M1712 Unilateral primary osteoarthritis, left knee: Secondary | ICD-10-CM

## 2012-09-08 DIAGNOSIS — M25569 Pain in unspecified knee: Secondary | ICD-10-CM | POA: Diagnosis not present

## 2012-09-08 HISTORY — PX: KNEE ARTHROPLASTY: SHX992

## 2012-09-08 SURGERY — ARTHROPLASTY, KNEE, TOTAL, USING IMAGELESS COMPUTER-ASSISTED NAVIGATION
Anesthesia: General | Site: Knee | Laterality: Left | Wound class: Clean

## 2012-09-08 MED ORDER — FLEET ENEMA 7-19 GM/118ML RE ENEM: 1.0000 | ENEMA | Freq: Once | RECTAL | Status: AC | PRN

## 2012-09-08 MED ORDER — METOCLOPRAMIDE HCL 5 MG/ML IJ SOLN: 10.0000 mg | Freq: Once | INTRAMUSCULAR | Status: DC | PRN

## 2012-09-08 MED ORDER — SENNOSIDES-DOCUSATE SODIUM 8.6-50 MG PO TABS: 1.0000 | ORAL_TABLET | Freq: Every evening | ORAL | Status: DC | PRN

## 2012-09-08 MED ORDER — MIDAZOLAM HCL 2 MG/2ML IJ SOLN: 1.0000 mg | INTRAMUSCULAR | Status: DC | PRN

## 2012-09-08 MED ORDER — ROCURONIUM BROMIDE 100 MG/10ML IV SOLN
INTRAVENOUS | Status: DC | PRN
  Administered 2012-09-08: 50 mg via INTRAVENOUS

## 2012-09-08 MED ORDER — MIDAZOLAM HCL 5 MG/5ML IJ SOLN
INTRAMUSCULAR | Status: DC | PRN
  Administered 2012-09-08 (×2): 1 mg via INTRAVENOUS

## 2012-09-08 MED ORDER — NALOXONE HCL 0.4 MG/ML IJ SOLN
0.4000 mg | INTRAMUSCULAR | Status: DC | PRN
  Filled 2012-09-08: qty 1

## 2012-09-08 MED ORDER — DIPHENHYDRAMINE HCL 50 MG/ML IJ SOLN: 12.5000 mg | Freq: Four times a day (QID) | INTRAMUSCULAR | Status: DC | PRN

## 2012-09-08 MED ORDER — PHENOL 1.4 % MT LIQD: 1.0000 | OROMUCOSAL | Status: DC | PRN

## 2012-09-08 MED ORDER — KCL IN DEXTROSE-NACL 20-5-0.45 MEQ/L-%-% IV SOLN
INTRAVENOUS | Status: AC
  Filled 2012-09-08: qty 1000

## 2012-09-08 MED ORDER — SIMVASTATIN 40 MG PO TABS
40.0000 mg | ORAL_TABLET | Freq: Every day | ORAL | Status: DC
  Administered 2012-09-08 – 2012-09-10 (×2): 40 mg via ORAL
  Filled 2012-09-08 (×4): qty 1

## 2012-09-08 MED ORDER — HYDROMORPHONE HCL PF 1 MG/ML IJ SOLN: 0.2500 mg | INTRAMUSCULAR | Status: DC | PRN

## 2012-09-08 MED ORDER — METHOCARBAMOL 500 MG PO TABS
500.0000 mg | ORAL_TABLET | Freq: Four times a day (QID) | ORAL | Status: DC | PRN
  Administered 2012-09-09 – 2012-09-11 (×3): 500 mg via ORAL
  Filled 2012-09-08 (×3): qty 1

## 2012-09-08 MED ORDER — SODIUM CHLORIDE 0.9 % IR SOLN
Status: DC | PRN
  Administered 2012-09-08: 3000 mL

## 2012-09-08 MED ORDER — VERAPAMIL HCL ER 240 MG PO CP24
240.0000 mg | ORAL_CAPSULE | Freq: Every day | ORAL | Status: DC
  Administered 2012-09-08 – 2012-09-09 (×2): 240 mg via ORAL
  Filled 2012-09-08 (×4): qty 1

## 2012-09-08 MED ORDER — METOCLOPRAMIDE HCL 10 MG PO TABS: 5.0000 mg | ORAL_TABLET | Freq: Three times a day (TID) | ORAL | Status: DC | PRN

## 2012-09-08 MED ORDER — DOCUSATE SODIUM 100 MG PO CAPS
100.0000 mg | ORAL_CAPSULE | Freq: Two times a day (BID) | ORAL | Status: DC
  Administered 2012-09-08 – 2012-09-11 (×6): 100 mg via ORAL
  Filled 2012-09-08 (×8): qty 1

## 2012-09-08 MED ORDER — OXYCODONE HCL 5 MG PO TABS: 5.0000 mg | ORAL_TABLET | Freq: Once | ORAL | Status: DC | PRN

## 2012-09-08 MED ORDER — BISACODYL 10 MG RE SUPP: 10.0000 mg | Freq: Every day | RECTAL | Status: DC | PRN

## 2012-09-08 MED ORDER — FENTANYL CITRATE 0.05 MG/ML IJ SOLN
INTRAMUSCULAR | Status: DC | PRN
  Administered 2012-09-08 (×2): 50 ug via INTRAVENOUS
  Administered 2012-09-08: 100 ug via INTRAVENOUS
  Administered 2012-09-08: 50 ug via INTRAVENOUS

## 2012-09-08 MED ORDER — OXYCODONE HCL 5 MG/5ML PO SOLN: 5.0000 mg | Freq: Once | ORAL | Status: DC | PRN

## 2012-09-08 MED ORDER — KCL IN DEXTROSE-NACL 20-5-0.45 MEQ/L-%-% IV SOLN
INTRAVENOUS | Status: DC
  Administered 2012-09-08: 16:00:00 via INTRAVENOUS
  Filled 2012-09-08 (×3): qty 1000

## 2012-09-08 MED ORDER — HYDROMORPHONE HCL PF 1 MG/ML IJ SOLN
INTRAMUSCULAR | Status: DC | PRN
  Administered 2012-09-08 (×2): 0.5 mg via INTRAVENOUS

## 2012-09-08 MED ORDER — SODIUM CHLORIDE 0.9 % IJ SOLN: 9.0000 mL | INTRAMUSCULAR | Status: DC | PRN

## 2012-09-08 MED ORDER — LIDOCAINE HCL (CARDIAC) 20 MG/ML IV SOLN
INTRAVENOUS | Status: DC | PRN
  Administered 2012-09-08: 50 mg via INTRAVENOUS

## 2012-09-08 MED ORDER — FOLIC ACID 1 MG PO TABS
1.0000 mg | ORAL_TABLET | ORAL | Status: DC
  Administered 2012-09-09 – 2012-09-11 (×3): 1 mg via ORAL
  Filled 2012-09-08 (×3): qty 1

## 2012-09-08 MED ORDER — LACTATED RINGERS IV SOLN
INTRAVENOUS | Status: DC | PRN
  Administered 2012-09-08 (×2): via INTRAVENOUS

## 2012-09-08 MED ORDER — ONDANSETRON HCL 4 MG/2ML IJ SOLN: 4.0000 mg | Freq: Four times a day (QID) | INTRAMUSCULAR | Status: DC | PRN

## 2012-09-08 MED ORDER — ACETAMINOPHEN 325 MG PO TABS: 650.0000 mg | ORAL_TABLET | Freq: Four times a day (QID) | ORAL | Status: DC | PRN

## 2012-09-08 MED ORDER — METOCLOPRAMIDE HCL 5 MG/ML IJ SOLN: 5.0000 mg | Freq: Three times a day (TID) | INTRAMUSCULAR | Status: DC | PRN

## 2012-09-08 MED ORDER — MENTHOL 3 MG MT LOZG: 1.0000 | LOZENGE | OROMUCOSAL | Status: DC | PRN

## 2012-09-08 MED ORDER — GLYCOPYRROLATE 0.2 MG/ML IJ SOLN
INTRAMUSCULAR | Status: DC | PRN
  Administered 2012-09-08: .6 mg via INTRAVENOUS

## 2012-09-08 MED ORDER — DIPHENHYDRAMINE HCL 12.5 MG/5ML PO ELIX: 12.5000 mg | ORAL_SOLUTION | ORAL | Status: DC | PRN

## 2012-09-08 MED ORDER — ONDANSETRON HCL 4 MG/2ML IJ SOLN
4.0000 mg | Freq: Four times a day (QID) | INTRAMUSCULAR | Status: DC | PRN
  Administered 2012-09-08: 4 mg via INTRAVENOUS
  Filled 2012-09-08: qty 2

## 2012-09-08 MED ORDER — ASPIRIN EC 325 MG PO TBEC
325.0000 mg | DELAYED_RELEASE_TABLET | Freq: Every day | ORAL | Status: DC
  Administered 2012-09-09 – 2012-09-11 (×3): 325 mg via ORAL
  Filled 2012-09-08 (×5): qty 1

## 2012-09-08 MED ORDER — OXYCODONE-ACETAMINOPHEN 5-325 MG PO TABS
1.0000 | ORAL_TABLET | ORAL | Status: DC | PRN
  Administered 2012-09-09 – 2012-09-11 (×5): 2 via ORAL
  Filled 2012-09-08 (×5): qty 2

## 2012-09-08 MED ORDER — LACTATED RINGERS IV SOLN: INTRAVENOUS | Status: DC

## 2012-09-08 MED ORDER — BUPIVACAINE-EPINEPHRINE PF 0.5-1:200000 % IJ SOLN
INTRAMUSCULAR | Status: DC | PRN
  Administered 2012-09-08: 25 mL

## 2012-09-08 MED ORDER — DEXTROSE 5 % IV SOLN
500.0000 mg | Freq: Four times a day (QID) | INTRAVENOUS | Status: DC | PRN
  Filled 2012-09-08: qty 5

## 2012-09-08 MED ORDER — ZOLPIDEM TARTRATE 5 MG PO TABS: 5.0000 mg | ORAL_TABLET | Freq: Every evening | ORAL | Status: DC | PRN

## 2012-09-08 MED ORDER — ACETAMINOPHEN 650 MG RE SUPP: 650.0000 mg | Freq: Four times a day (QID) | RECTAL | Status: DC | PRN

## 2012-09-08 MED ORDER — ONDANSETRON HCL 4 MG PO TABS: 4.0000 mg | ORAL_TABLET | Freq: Four times a day (QID) | ORAL | Status: DC | PRN

## 2012-09-08 MED ORDER — ARTIFICIAL TEARS OP OINT
TOPICAL_OINTMENT | OPHTHALMIC | Status: DC | PRN
  Administered 2012-09-08: 1 via OPHTHALMIC

## 2012-09-08 MED ORDER — MORPHINE SULFATE (PF) 1 MG/ML IV SOLN
INTRAVENOUS | Status: DC
  Administered 2012-09-08: 16:00:00 via INTRAVENOUS
  Administered 2012-09-08: 2 mg via INTRAVENOUS
  Administered 2012-09-09: 1 mg via INTRAVENOUS
  Administered 2012-09-09: 2 mg via INTRAVENOUS

## 2012-09-08 MED ORDER — DEXAMETHASONE SODIUM PHOSPHATE 4 MG/ML IJ SOLN
INTRAMUSCULAR | Status: DC | PRN
  Administered 2012-09-08: 4 mg via INTRAVENOUS

## 2012-09-08 MED ORDER — DIPHENHYDRAMINE HCL 12.5 MG/5ML PO ELIX: 12.5000 mg | ORAL_SOLUTION | Freq: Four times a day (QID) | ORAL | Status: DC | PRN

## 2012-09-08 MED ORDER — CEFAZOLIN SODIUM 1-5 GM-% IV SOLN
1.0000 g | Freq: Four times a day (QID) | INTRAVENOUS | Status: AC
  Administered 2012-09-08 – 2012-09-09 (×2): 1 g via INTRAVENOUS
  Filled 2012-09-08 (×2): qty 50

## 2012-09-08 MED ORDER — FENTANYL CITRATE 0.05 MG/ML IJ SOLN: 50.0000 ug | INTRAMUSCULAR | Status: DC | PRN

## 2012-09-08 MED ORDER — ONDANSETRON HCL 4 MG/2ML IJ SOLN
INTRAMUSCULAR | Status: DC | PRN
  Administered 2012-09-08: 4 mg via INTRAVENOUS

## 2012-09-08 MED ORDER — PROPOFOL 10 MG/ML IV BOLUS
INTRAVENOUS | Status: DC | PRN
  Administered 2012-09-08: 150 mg via INTRAVENOUS

## 2012-09-08 MED ORDER — HYDROCODONE-ACETAMINOPHEN 7.5-325 MG PO TABS
1.0000 | ORAL_TABLET | Freq: Four times a day (QID) | ORAL | Status: DC
  Administered 2012-09-08: 1 via ORAL
  Filled 2012-09-08: qty 1

## 2012-09-08 MED ORDER — NEOSTIGMINE METHYLSULFATE 1 MG/ML IJ SOLN
INTRAMUSCULAR | Status: DC | PRN
  Administered 2012-09-08: 4 mg via INTRAVENOUS

## 2012-09-08 MED ORDER — LABETALOL HCL 5 MG/ML IV SOLN
INTRAVENOUS | Status: DC | PRN
  Administered 2012-09-08: 5 mg via INTRAVENOUS

## 2012-09-08 SURGICAL SUPPLY — 71 items
APL SKNCLS STERI-STRIP NONHPOA (GAUZE/BANDAGES/DRESSINGS) ×1
BANDAGE ELASTIC 4 VELCRO ST LF (GAUZE/BANDAGES/DRESSINGS) ×2 IMPLANT
BANDAGE ELASTIC 6 VELCRO ST LF (GAUZE/BANDAGES/DRESSINGS) ×1 IMPLANT
BANDAGE ESMARK 6X9 LF (GAUZE/BANDAGES/DRESSINGS) ×1 IMPLANT
BENZOIN TINCTURE PRP APPL 2/3 (GAUZE/BANDAGES/DRESSINGS) ×2 IMPLANT
BLADE SAGITTAL 25.0X1.19X90 (BLADE) ×2 IMPLANT
BLADE SAW SGTL 13X75X1.27 (BLADE) ×2 IMPLANT
BLADE SURG 10 STRL SS (BLADE) ×1 IMPLANT
BNDG CMPR 9X6 STRL LF SNTH (GAUZE/BANDAGES/DRESSINGS) ×1
BNDG CMPR MED 10X6 ELC LF (GAUZE/BANDAGES/DRESSINGS) ×1
BNDG ELASTIC 6X10 VLCR STRL LF (GAUZE/BANDAGES/DRESSINGS) ×2 IMPLANT
BNDG ESMARK 6X9 LF (GAUZE/BANDAGES/DRESSINGS) ×2
BOWL SMART MIX CTS (DISPOSABLE) ×2 IMPLANT
CEMENT HV SMART SET (Cement) ×3 IMPLANT
CLOTH BEACON ORANGE TIMEOUT ST (SAFETY) ×2 IMPLANT
COVER BACK TABLE 24X17X13 BIG (DRAPES) IMPLANT
COVER SURGICAL LIGHT HANDLE (MISCELLANEOUS) ×2 IMPLANT
CUFF TOURNIQUET SINGLE 34IN LL (TOURNIQUET CUFF) ×2 IMPLANT
CUFF TOURNIQUET SINGLE 44IN (TOURNIQUET CUFF) IMPLANT
DRAPE ORTHO SPLIT 77X108 STRL (DRAPES) ×4
DRAPE SURG ORHT 6 SPLT 77X108 (DRAPES) ×2 IMPLANT
DRAPE U-SHAPE 47X51 STRL (DRAPES) ×2 IMPLANT
DRSG PAD ABDOMINAL 8X10 ST (GAUZE/BANDAGES/DRESSINGS) ×3 IMPLANT
DURAPREP 26ML APPLICATOR (WOUND CARE) ×2 IMPLANT
ELECT REM PT RETURN 9FT ADLT (ELECTROSURGICAL) ×2
ELECTRODE REM PT RTRN 9FT ADLT (ELECTROSURGICAL) ×1 IMPLANT
FACESHIELD LNG OPTICON STERILE (SAFETY) ×3 IMPLANT
GAUZE XEROFORM 5X9 LF (GAUZE/BANDAGES/DRESSINGS) ×2 IMPLANT
GLOVE BIOGEL PI IND STRL 7.5 (GLOVE) ×1 IMPLANT
GLOVE BIOGEL PI IND STRL 8 (GLOVE) ×1 IMPLANT
GLOVE BIOGEL PI INDICATOR 7.5 (GLOVE) ×1
GLOVE BIOGEL PI INDICATOR 8 (GLOVE) ×1
GLOVE ECLIPSE 7.0 STRL STRAW (GLOVE) ×2 IMPLANT
GLOVE ORTHO TXT STRL SZ7.5 (GLOVE) ×2 IMPLANT
GOWN PREVENTION PLUS LG XLONG (DISPOSABLE) IMPLANT
GOWN PREVENTION PLUS XLARGE (GOWN DISPOSABLE) ×2 IMPLANT
GOWN STRL NON-REIN LRG LVL3 (GOWN DISPOSABLE) ×5 IMPLANT
HANDPIECE INTERPULSE COAX TIP (DISPOSABLE) ×2
IMMOBILIZER KNEE 22 UNIV (SOFTGOODS) ×2 IMPLANT
KIT BASIN OR (CUSTOM PROCEDURE TRAY) ×2 IMPLANT
KIT ROOM TURNOVER OR (KITS) ×2 IMPLANT
MANIFOLD NEPTUNE II (INSTRUMENTS) ×2 IMPLANT
MARKER SPHERE PSV REFLC THRD 5 (MARKER) ×6 IMPLANT
NDL 1/2 CIR MAYO (NEEDLE) ×1 IMPLANT
NDL HYPO 25GX1X1/2 BEV (NEEDLE) ×1 IMPLANT
NEEDLE 1/2 CIR MAYO (NEEDLE) IMPLANT
NEEDLE HYPO 25GX1X1/2 BEV (NEEDLE) IMPLANT
NS IRRIG 1000ML POUR BTL (IV SOLUTION) ×2 IMPLANT
PACK TOTAL JOINT (CUSTOM PROCEDURE TRAY) ×2 IMPLANT
PAD ARMBOARD 7.5X6 YLW CONV (MISCELLANEOUS) ×4 IMPLANT
PAD CAST 4YDX4 CTTN HI CHSV (CAST SUPPLIES) ×1 IMPLANT
PADDING CAST ABS 6INX4YD NS (CAST SUPPLIES) ×1
PADDING CAST ABS COTTON 6X4 NS (CAST SUPPLIES) IMPLANT
PADDING CAST COTTON 4X4 STRL (CAST SUPPLIES) ×2
PADDING CAST COTTON 6X4 STRL (CAST SUPPLIES) ×2 IMPLANT
PIN SCHANZ 4MM 130MM (PIN) ×8 IMPLANT
SET HNDPC FAN SPRY TIP SCT (DISPOSABLE) ×1 IMPLANT
SPONGE GAUZE 4X4 12PLY (GAUZE/BANDAGES/DRESSINGS) ×2 IMPLANT
STAPLER VISISTAT 35W (STAPLE) ×2 IMPLANT
STRIP CLOSURE SKIN 1/2X4 (GAUZE/BANDAGES/DRESSINGS) ×4 IMPLANT
SUCTION FRAZIER TIP 10 FR DISP (SUCTIONS) ×2 IMPLANT
SUT ETHIBOND NAB CT1 #1 30IN (SUTURE) ×2 IMPLANT
SUT VIC AB 2-0 CT1 27 (SUTURE) ×6
SUT VIC AB 2-0 CT1 TAPERPNT 27 (SUTURE) ×2 IMPLANT
SUT VICRYL 4-0 PS2 18IN ABS (SUTURE) ×2 IMPLANT
SUT VICRYL AB 2 0 TIES (SUTURE) ×2 IMPLANT
SYR CONTROL 10ML LL (SYRINGE) ×1 IMPLANT
TOWEL OR 17X24 6PK STRL BLUE (TOWEL DISPOSABLE) ×2 IMPLANT
TOWEL OR 17X26 10 PK STRL BLUE (TOWEL DISPOSABLE) ×2 IMPLANT
TRAY FOLEY CATH 14FR (SET/KITS/TRAYS/PACK) ×2 IMPLANT
WATER STERILE IRR 1000ML POUR (IV SOLUTION) ×6 IMPLANT

## 2012-09-08 NOTE — Anesthesia Postprocedure Evaluation (Signed)
Anesthesia Post Note  Patient: Cristina Barton  Procedure(s) Performed: Procedure(s) (LRB): COMPUTER ASSISTED TOTAL KNEE ARTHROPLASTY (Left)  Anesthesia type: general  Patient location: PACU  Post pain: Pain level controlled  Post assessment: Patient's Cardiovascular Status Stable  Last Vitals:  Filed Vitals:   09/08/12 1545  BP: 157/89  Pulse: 57  Temp:   Resp: 11    Post vital signs: Reviewed and stable  Level of consciousness: sedated  Complications: No apparent anesthesia complications

## 2012-09-08 NOTE — H&P (Signed)
TOTAL KNEE ADMISSION H&P  Patient is being admitted for left total knee arthroplasty.  Subjective:  Chief Complaint:left knee pain.  HPI: Cristina Barton, 77 y.o. female, has a history of pain and functional disability in the left knee due to arthritis and has failed non-surgical conservative treatments for greater than 12 weeks to includeNSAID's and/or analgesics, corticosteriod injections, use of assistive devices and activity modification.  Onset of symptoms was gradual, starting 1 years ago with gradually worsening course since that time. The patient noted no past surgery on the left knee(s).  Patient currently rates pain in the left knee(s) at 8 out of 10 with activity. Patient has night pain, worsening of pain with activity and weight bearing, pain that interferes with activities of daily living and crepitus.  Patient has evidence of subchondral sclerosis, periarticular osteophytes and joint space narrowing by imaging studies. This patient has had MRI with tricompartmental degenerative changes,torn posterior meniscus, bone on bone changes in the medial compartment, full thickness loss in the medial compartment, ACL mucoid degeneration,moderate MCL degeneration and large raidal tear of the meniscus root.of the left knee.  No active infection.  Patient Active Problem List   Diagnosis Date Noted  . Spinal stenosis, lumbar region, with neurogenic claudication 03/12/2012    Class: Diagnosis of   Past Medical History  Diagnosis Date  . Hypertension     takes Verapamil nightly  . Hyperlipidemia     takes Pravastatin nightly  . Joint pain   . Psoriasis     methotrexate on Mondays-last dose 08/25/2012  . Early cataracts, bilateral   . Arthritis     "back";knee    Past Surgical History  Procedure Date  . Tonsillectomy     as a child  . Colonoscopy   . Laminectomy and microdiscectomy lumbar spine 03/12/2012  . Lumbar laminectomy 03/12/2012    Procedure: MICRODISCECTOMY LUMBAR LAMINECTOMY;   Surgeon: Eldred Manges, MD;  Location: [facility] OR;  Service: Orthopedics;  Laterality: N/A;  L3-4, L4-5 Decompression    Prescriptions prior to admission  Medication Sig Dispense Refill  . Calcium Carbonate-Vitamin D (CALCIUM 600 + D PO) Take 1 tablet by mouth daily.      . folic acid (FOLVITE) 1 MG tablet Take 1 mg by mouth See admin instructions. Every day except Monday      . methotrexate (RHEUMATREX) 2.5 MG tablet Take 5 mg by mouth once a week. Mondays. Caution:Chemotherapy. Protect from light.      . pravastatin (PRAVACHOL) 40 MG tablet Take 40 mg by mouth at bedtime.      . verapamil (VERELAN PM) 240 MG 24 hr capsule Take 240 mg by mouth at bedtime.       Allergies  Allergen Reactions  . Sulfa Antibiotics Rash    History  Substance Use Topics  . Smoking status: Never Smoker   . Smokeless tobacco: Never Used  . Alcohol Use: No    History reviewed. No pertinent family history.   Review of Systems  Constitutional: Negative.   HENT: Negative.   Eyes: Negative.   Respiratory: Negative.   Cardiovascular: Negative.   Gastrointestinal: Negative.   Genitourinary: Negative.   Musculoskeletal: Positive for back pain and joint pain.       Left knee pain  Skin: Negative.   Neurological: Negative.   Endo/Heme/Allergies: Negative.   Psychiatric/Behavioral: Negative.     Objective:  Physical Exam  Constitutional: She is oriented to person, place, and time. She appears well-developed and well-nourished.  HENT:  Head: Normocephalic and atraumatic.  Eyes: EOM are normal. Pupils are equal, round, and reactive to light.  Neck: Normal range of motion.  Cardiovascular: Normal rate, regular rhythm and intact distal pulses.   Respiratory: Effort normal.  Musculoskeletal:       Left knee ROM 3- 100 degrees.  1 cm of quad atrophy.  Crepitus with ROM. Medial joint line tenderness.  Neurological: She is alert and oriented to person, place, and time.  Skin: Skin is warm and dry.  Psychiatric:  She has a normal mood and affect.    Vital signs in last 24 hours: Temp:  [97.9 F (36.6 C)] 97.9 F (36.6 C) (02/03 1041) Pulse Rate:  [67-81] 67  (02/03 1145) Resp:  [20] 20  (02/03 1041) BP: (137)/(72) 137/72 mmHg (02/03 1041) SpO2:  [97 %-100 %] 100 % (02/03 1145)  Labs:   Estimated Body mass index is 33.49 kg/(m^2) as calculated from the following:   Height as of 03/11/12: 5\' 4" (1.626 m).   Weight as of 03/11/12: 195 lb 1.6 oz(88.497 kg).   Imaging Review Plain radiographs demonstrate severe degenerative joint disease of the left knee(s). The overall alignment issignificant varus. The bone quality appears to be adequate for age and reported activity level.  Assessment/Plan:  End stage arthritis, left knee   The patient history, physical examination, clinical judgment of the provider and imaging studies are consistent with end stage degenerative joint disease of the left knee(s) and total knee arthroplasty is deemed medically necessary. The treatment options including medical management, injection therapy arthroscopy and arthroplasty were discussed at length. The risks and benefits of total knee arthroplasty were presented and reviewed. The risks due to aseptic loosening, infection, stiffness, patella tracking problems, thromboembolic complications and other imponderables were discussed. The patient acknowledged the explanation, agreed to proceed with the plan and consent was signed. Patient is being admitted for inpatient treatment for surgery, pain control, PT, OT, prophylactic antibiotics, VTE prophylaxis, progressive ambulation and ADL's and discharge planning. The patient is planning to be discharged home with home health services

## 2012-09-08 NOTE — Op Note (Signed)
Test test  Preop diagnosis left knee osteoarthritis  Postop diagnosis: Left knee osteoarthritis  Procedure: Cemented left total knee arthroplasty, computer assist  Components Sigma #3 cemented femur with lugs 12.5 rotating platform polyethylene bearing, 2.5 cemented tibial component 35 mm 3 peg all poly-patella, Depuy  Surgeon: Annell Greening M.D.  Asst.: Maud Deed PA-C medically necessary present for the entire procedure  Anesthesia preoperative obturator block plus general anesthesia  Tourniquet: 65 minutes x350  torr  Complications: None  This 77 year old female set progressive knee osteoarthritis with medial femoral compartment erosive changes multiple loose bodies marginal osteophytes and failed conservative treatment including use of a cane multiple injections anti-inflammatories with progressive of varus and flexion deformity.  Procedure: After induction of preoperative block general anesthesia operculum patient placement of Foley catheter proximal thigh tourniquet prepping from the tip the toes the tourniquet with DuraPrep the usual impervious stockinette Caban split sheets drapes sterile skin marker Betadine Steri-Drape was used still skin timeout procedure was completed 2 g of Ancef was given prophylactically.   Incision was made superficial retinaculum was divided. Leg was wrapped with a esmark  prior to work tourniquet inflation.. Medial parapatellar incision was made to the deep retinaculum splitting the quad tendon between the medial one third and lateral two thirds patella was everted 10 mm removed using a sling saw from facet the facet measurement of the out resected bone passionate measured 10 mm. Sizing hole pegs were prepared. Pins are placed in the femur and stab incision made mid-tibia 2 pins are placed bicortical and computer Mauser generated for both the tibia and the femur. 9 mm resected off the distal femur initially. Sizing was appropriate for #3. Rotation was  marked chamfer cuts are made box cut was not made until the AP cut was made in the tibia which was sized for 2.5 trial sizer was appropriate and bone was resected with the 0 slope. Verification showed less than a millimeter off and rotation. He'll cut was made in the tibia and then trials were inserted. 10 mm gave 3.5 of extension and medial lateral balance was within 1 mm. 12.5 bearing gave better rod stability both flexion-extension balance of the collaterals. Cement was vacuum mixed while pulsatile lavage was used for bone preparation. Meticulous drying placement Mikael posteriorly. Tibia cemented first excessive cement was removed after it was impacted using impactor and hammer. Femur was cemented next followed by the Polly 4.5 permanent mobile-bearing being placed. Patella was held with the patellar clamp after cement was applied and the squeeze down flush. All excessive cement had been removed cement was hard at 60 minutes tourniquet was deflated hemostasis obtained in standard layer closure with #1 Vicryl the deep fascia 2-0 Vicryl superficial retinaculum subtendinous tissue subcuticular closure. Subcuticular closure the 2 stab incisions were computer pins in the mid-tibia. Postoperative dressing was applied and knee immobilizer. Patient tolerated the procedure well. Hemostasis was obtained prior to closure with tourniquet deflation after the cement was hard. There was normal patellar tracking full extension on the computer with 2 hyperextension flexion to under 20 good patellar tracking and balance the collateral ligaments both in flexion-extension with measurements of 23 and 23.5 mm.  for medial compartment lateral compartment respectively.  Annell Greening M.D.

## 2012-09-08 NOTE — Progress Notes (Signed)
Orthopedic Tech Progress Note Patient Details:  Cristina Barton Dec 14, 1935 [id number]  CPM Left Knee CPM Left Knee: On Left Knee Flexion (Degrees): 60  Left Knee Extension (Degrees): 0  Additional Comments: applied overhead frame   Jennye Moccasin 09/08/2012, 4:17 PM

## 2012-09-08 NOTE — Interval H&P Note (Signed)
History and Physical Interval Note:  09/08/2012 12:47 PM  Cristina Barton  has presented today for surgery, with the diagnosis of Left knee osteoarthritis  The various methods of treatment have been discussed with the patient and family. After consideration of risks, benefits and other options for treatment, the patient has consented to  Procedure(s) (LRB) with comments: COMPUTER ASSISTED TOTAL KNEE ARTHROPLASTY (Left) - Left total knee arthroplasty, cemented as a surgical intervention .  The patient's history has been reviewed, patient examined, no change in status, stable for surgery.  I have reviewed the patient's chart and labs.  Questions were answered to the patient's satisfaction.     Preston Weill C

## 2012-09-08 NOTE — Interval H&P Note (Signed)
History and Physical Interval Note:  09/08/2012 12:46 PM  Cristina Barton  has presented today for surgery, with the diagnosis of Left knee osteoarthritis  The various methods of treatment have been discussed with the patient and family. After consideration of risks, benefits and other options for treatment, the patient has consented to  Procedure(s) (LRB) with comments: COMPUTER ASSISTED TOTAL KNEE ARTHROPLASTY (Left) - Left total knee arthroplasty, cemented as a surgical intervention .  The patient's history has been reviewed, patient examined, no change in status, stable for surgery.  I have reviewed the patient's chart and labs.  Questions were answered to the patient's satisfaction.     Junah Yam C

## 2012-09-08 NOTE — Transfer of Care (Signed)
Immediate Anesthesia Transfer of Care Note  Patient: Cristina Barton  Procedure(s) Performed: Procedure(s) (LRB) with comments: COMPUTER ASSISTED TOTAL KNEE ARTHROPLASTY (Left) - Left total knee arthroplasty, cemented  Patient Location: PACU  Anesthesia Type:General  Level of Consciousness: sedated  Airway & Oxygen Therapy: Patient Spontanous Breathing and Patient connected to nasal cannula oxygen  Post-op Assessment: Report given to PACU RN, Post -op Vital signs reviewed and stable and Patient moving all extremities  Post vital signs: Reviewed and stable  Complications: No apparent anesthesia complications

## 2012-09-08 NOTE — Evaluation (Signed)
Physical Therapy Evaluation Patient Details Name: Cristina Barton MRN: [id number] DOB: [birth year] Today's Date: 09/08/2012 Time: 1884-1660 PT Time Calculation (min): 29 min  PT Assessment / Plan / Recommendation Clinical Impression  Patient is a 77 yo female s/p Lt. TKA.  Will benefit from acute PT to maximize independence prior to discharge.  Patient lives alone.  Recommend SNF at discharge for continued therapy.    PT Assessment  Patient needs continued PT services    Follow Up Recommendations  SNF    Does the patient have the potential to tolerate intense rehabilitation      Barriers to Discharge Decreased caregiver support      Equipment Recommendations  None recommended by PT    Recommendations for Other Services     Frequency 7X/week    Precautions / Restrictions Precautions Precautions: Knee Precaution Booklet Issued: Yes (comment) Precaution Comments: Reviewed precautions with patient. Required Braces or Orthoses: Knee Immobilizer - Left Knee Immobilizer - Left: On except when in CPM;Discontinue once straight leg raise with < 10 degree lag Restrictions Weight Bearing Restrictions: Yes LLE Weight Bearing: Weight bearing as tolerated   Pertinent Vitals/Pain Pain and nausea limiting session      Mobility  Bed Mobility Bed Mobility: Supine to Sit;Sitting - Scoot to Edge of Bed;Sit to Supine Supine to Sit: 4: Min assist;With rails;HOB elevated Sitting - Scoot to Edge of Bed: 4: Min assist;With rail Sit to Supine: 4: Min assist;With rail;HOB elevated Details for Bed Mobility Assistance: Verbal cues for technique.  Assist to move LLE off of and onto bed.  Patient sat at EOB x 8 minutes with good balance.  Became nauseated and returned to supine.  RN called. Transfers Transfers: Not assessed    Exercises Total Joint Exercises Ankle Circles/Pumps: AROM;Both;10 reps;Supine   PT Diagnosis: Difficulty walking;Generalized weakness;Acute pain  PT Problem List: Decreased  strength;Decreased range of motion;Decreased activity tolerance;Decreased mobility;Decreased knowledge of use of DME;Decreased knowledge of precautions;Pain PT Treatment Interventions: DME instruction;Gait training;Functional mobility training;Therapeutic exercise;Patient/family education   PT Goals Acute Rehab PT Goals PT Goal Formulation: With patient Time For Goal Achievement: 09/15/12 Potential to Achieve Goals: Good Pt will go Supine/Side to Sit: Independently;with HOB 0 degrees PT Goal: Supine/Side to Sit - Progress: Goal set today Pt will go Sit to Supine/Side: Independently;with HOB 0 degrees PT Goal: Sit to Supine/Side - Progress: Goal set today Pt will go Sit to Stand: with supervision;with upper extremity assist PT Goal: Sit to Stand - Progress: Goal set today Pt will Ambulate: 51 - 150 feet;with supervision;with rolling walker PT Goal: Ambulate - Progress: Goal set today Pt will Perform Home Exercise Program: with supervision, verbal cues required/provided PT Goal: Perform Home Exercise Program - Progress: Goal set today  Visit Information  Last PT Received On: 09/08/12 Assistance Needed: +2    Subjective Data  Subjective: "I want to go to rehab."  "I'm feeling sick" (Patient nauseated in sitting) Patient Stated Goal: To walk   Prior Functioning  Home Living Lives With: Alone Available Help at Discharge: Skilled Nursing Facility Home Adaptive Equipment: Walker - rolling Prior Function Level of Independence: Independent Able to Take Stairs?: Yes Driving: Yes Vocation: Retired Musician: No difficulties    Copywriter, advertising Overall Cognitive Status: Appears within functional limits for tasks assessed/performed Arousal/Alertness: Lethargic Orientation Level: Appears intact for tasks assessed Behavior During Session: Lethargic    Extremity/Trunk Assessment Right Upper Extremity Assessment RUE ROM/Strength/Tone: [facility] for tasks assessed RUE  Sensation: WFL - Light Touch  Left Upper Extremity Assessment LUE ROM/Strength/Tone: WFL for tasks assessed LUE Sensation: WFL - Light Touch Right Lower Extremity Assessment RLE ROM/Strength/Tone: WFL for tasks assessed RLE Sensation: WFL - Light Touch Left Lower Extremity Assessment LLE ROM/Strength/Tone: Deficits;Unable to fully assess;Due to pain LLE ROM/Strength/Tone Deficits: Able to assist moving LLE off of and onto bed   Balance Balance Balance Assessed: Yes Static Sitting Balance Static Sitting - Balance Support: No upper extremity supported;Feet supported Static Sitting - Level of Assistance: 5: Stand by assistance Static Sitting - Comment/# of Minutes: 8 - Patient became nauseated and returned to supine  End of Session PT - End of Session Equipment Utilized During Treatment: Left knee immobilizer;Oxygen Activity Tolerance: Patient limited by pain;Treatment limited secondary to medical complications (Comment) (Nausea) Patient left: in bed;in CPM;with call bell/phone within reach Nurse Communication: Mobility status (Need for nausea meds) CPM Left Knee CPM Left Knee: On Left Knee Flexion (Degrees): 60  Left Knee Extension (Degrees): 0   GP     Vena Austria 09/08/2012, 8:01 PM Durenda Hurt. Renaldo Fiddler, [facility] Acute Rehab Services Pager [phone]

## 2012-09-08 NOTE — Anesthesia Postprocedure Evaluation (Signed)
  Anesthesia Post-op Note  Patient: Cristina Barton  Procedure(s) Performed: Procedure(s) (LRB) with comments: COMPUTER ASSISTED TOTAL KNEE ARTHROPLASTY (Left) - Left total knee arthroplasty, cemented  Patient Location: PACU  Anesthesia Type:General  Level of Consciousness: awake, oriented and patient cooperative  Airway and Oxygen Therapy: Patient Spontanous Breathing  Post-op Pain: mild  Post-op Assessment: Post-op Vital signs reviewed, Patient's Cardiovascular Status Stable, Respiratory Function Stable, Patent Airway, No signs of Nausea or vomiting and Pain level controlled  Post-op Vital Signs: stable  Complications: No apparent anesthesia complications

## 2012-09-08 NOTE — Anesthesia Preprocedure Evaluation (Addendum)
Anesthesia Evaluation  Patient identified by MRN, date of birth, ID band Patient awake    Reviewed: Allergy & Precautions, H&P , NPO status , Patient's Chart, lab work & pertinent test results, reviewed documented beta blocker date and time   Airway Mallampati: II TM Distance: >3 FB Neck ROM: full    Dental   Pulmonary neg pulmonary ROS,  breath sounds clear to auscultation        Cardiovascular hypertension, On Medications Rhythm:regular     Neuro/Psych negative neurological ROS  negative psych ROS   GI/Hepatic negative GI ROS, Neg liver ROS,   Endo/Other  negative endocrine ROSMorbid obesity  Renal/GU negative Renal ROS  negative genitourinary   Musculoskeletal   Abdominal   Peds  Hematology negative hematology ROS (+)   Anesthesia Other Findings See surgeon's H&P   Reproductive/Obstetrics negative OB ROS                          Anesthesia Physical Anesthesia Plan  ASA: III  Anesthesia Plan: General   Post-op Pain Management:    Induction: Intravenous  Airway Management Planned: Oral ETT  Additional Equipment:   Intra-op Plan:   Post-operative Plan: Extubation in OR  Informed Consent: I have reviewed the patients History and Physical, chart, labs and discussed the procedure including the risks, benefits and alternatives for the proposed anesthesia with the patient or authorized representative who has indicated his/her understanding and acceptance.   Dental Advisory Given  Plan Discussed with: CRNA and Surgeon  Anesthesia Plan Comments:        Anesthesia Quick Evaluation

## 2012-09-08 NOTE — Anesthesia Procedure Notes (Addendum)
Anesthesia Regional Block:  Femoral nerve block  Pre-Anesthetic Checklist: ,, timeout performed, Correct Patient, Correct Site, Correct Laterality, Correct Procedure, Correct Position, site marked, Risks and benefits discussed,  Surgical consent,  Pre-op evaluation,  At surgeon's request and post-op pain management  Laterality: Left  Prep: chloraprep       Needles:   Needle Type: Other     Needle Length: 9cm  Needle Gauge: 21    Additional Needles:  Procedures: ultrasound guided (picture in chart) Femoral nerve block Narrative:  Start time: 09/08/2012 11:55 AM End time: 09/08/2012 12:02 PM Injection made incrementally with aspirations every 5 mL.  Performed by: Personally  Anesthesiologist: C. Frederick MD  Additional Notes: Ultrasound guidance used to: id relevant anatomy, confirm needle position, local anesthetic spread, avoidance of vascular puncture. Picture saved. No complications. Block performed personally by Janetta Hora. Gelene Mink, MD    Femoral nerve block

## 2012-09-08 NOTE — Brief Op Note (Cosign Needed)
09/08/2012  3:17 PM  PATIENT:  [name]  77 y.o. female  PRE-OPERATIVE DIAGNOSIS:  Left knee osteoarthritis  POST-OPERATIVE DIAGNOSIS:  Left knee osteoarthritis  PROCEDURE:  Procedure(s) (LRB) with comments: COMPUTER ASSISTED TOTAL KNEE ARTHROPLASTY (Left) - Left total knee arthroplasty, cemented  SURGEON:  Surgeon(s) and Role:    * Eldred Manges, MD - Primary  PHYSICIAN ASSISTANT: Maud Deed [facility]  ASSISTANTS: none   ANESTHESIA:   regional and general  EBL:  Total I/O In: 1700 [I.V.:1700] Out: 225 [Urine:150; Blood:75]  BLOOD ADMINISTERED:none  DRAINS: none   LOCAL MEDICATIONS USED:  NONE  SPECIMEN:  No Specimen  DISPOSITION OF SPECIMEN:  N/A  COUNTS:  YES  TOURNIQUET:   Total Tourniquet Time Documented: Thigh (Left) - 67 minutes  DICTATION: .Note written in EPIC  PLAN OF CARE: Admit to inpatient   PATIENT DISPOSITION:  PACU - hemodynamically stable.   Delay start of Pharmacological VTE agent (>24hrs) due to surgical blood loss or risk of bleeding: yes

## 2012-09-09 HISTORY — PX: TOTAL KNEE ARTHROPLASTY: SHX125

## 2012-09-09 LAB — CBC
MCHC: 33.1 g/dL (ref 30.0–36.0)
Platelets: 170 10*3/uL (ref 150–400)
RDW: 13.9 % (ref 11.5–15.5)
WBC: 7.4 10*3/uL (ref 4.0–10.5)

## 2012-09-09 LAB — BASIC METABOLIC PANEL
BUN: 10 mg/dL (ref 6–23)
Chloride: 102 mEq/L (ref 96–112)
GFR calc Af Amer: >90 mL/min (ref >90)
GFR calc non Af Amer: 84 mL/min — ABNORMAL LOW (ref >90)
Potassium: 4.4 mEq/L (ref 3.5–5.1)

## 2012-09-09 NOTE — Progress Notes (Signed)
Physical Therapy Treatment Patient Details Name: Cristina Barton MRN: [id number] DOB: [birth year] Today's Date: 09/09/2012 Time: 8657-8469 PT Time Calculation (min): 32 min  PT Assessment / Plan / Recommendation Comments on Treatment Session  Pt mobility and activity tolerance improving.       Follow Up Recommendations  SNF     Does the patient have the potential to tolerate intense rehabilitation     Barriers to Discharge        Equipment Recommendations  None recommended by PT    Recommendations for Other Services    Frequency 7X/week   Plan Discharge plan remains appropriate;Frequency remains appropriate    Precautions / Restrictions Precautions Precautions: Knee Precaution Booklet Issued: Yes (comment) Precaution Comments: Reviewed precautions with patient. Required Braces or Orthoses: Knee Immobilizer - Left Knee Immobilizer - Left: On except when in CPM;Discontinue once straight leg raise with < 10 degree lag Restrictions Weight Bearing Restrictions: Yes LLE Weight Bearing: Weight bearing as tolerated   Pertinent Vitals/Pain 5/10 pain in knee.   Pt medicated 30 minutes prior to session.     Mobility  Bed Mobility Bed Mobility: Supine to Sit;Sitting - Scoot to Edge of Bed Supine to Sit: 4: Min assist;With rails;HOB elevated Sitting - Scoot to [place] of Bed: 4: Min assist;With rail Sit to Supine: Not Tested (comment) Details for Bed Mobility Assistance: Assist for LLE, cues for technique.  Transfers Transfers: Sit to Stand;Stand to Sit Sit to Stand: 3: Mod assist;From bed;With upper extremity assist Stand to Sit: 3: Mod assist;To bed;With upper extremity assist Details for Transfer Assistance: cueing for technique. Assist to steady pt secondary to pain and weakness in LLE.   Ambulation/Gait Ambulation/Gait Assistance: 4: Min guard Ambulation Distance (Feet): 15 Feet Assistive device: Rolling walker Ambulation/Gait Assistance Details: Vcs for sequencing and decreased  dependence on UES.   Gait Pattern: Step-to pattern Stairs: No    Exercises Total Joint Exercises Ankle Circles/Pumps: AROM;Both;10 reps;Supine   PT Diagnosis:    PT Problem List:   PT Treatment Interventions:     PT Goals Acute Rehab PT Goals PT Goal Formulation: With patient Time For Goal Achievement: 09/15/12 Potential to Achieve Goals: Good Pt will go Supine/Side to Sit: Independently;with HOB 0 degrees PT Goal: Supine/Side to Sit - Progress: Progressing toward goal Pt will go Sit to Supine/Side: Independently;with HOB 0 degrees PT Goal: Sit to Supine/Side - Progress: Progressing toward goal Pt will go Sit to Stand: with supervision;with upper extremity assist PT Goal: Sit to Stand - Progress: Progressing toward goal Pt will Ambulate: 51 - 150 feet;with supervision;with rolling walker PT Goal: Ambulate - Progress: Progressing toward goal Pt will Perform Home Exercise Program: with supervision, verbal cues required/provided PT Goal: Perform Home Exercise Program - Progress: Progressing toward goal  Visit Information  Last PT Received On: 09/09/12 Assistance Needed: +2    Subjective Data  Subjective: I feel better todat Patient Stated Goal: to walk   Cognition  Cognition Overall Cognitive Status: Appears within functional limits for tasks assessed/performed Arousal/Alertness: Awake/alert Orientation Level: Appears intact for tasks assessed    Balance  Balance Balance Assessed: Yes Static Sitting Balance Static Sitting - Balance Support: Bilateral upper extremity supported Static Sitting - Level of Assistance: 5: Stand by assistance Static Sitting - Comment/# of Minutes: no c/o dizziness or nausea.   End of Session PT - End of Session Equipment Utilized During Treatment: Left knee immobilizer;Oxygen Activity Tolerance: Patient limited by pain;Treatment limited secondary to medical complications (Comment) Patient left: in  bed;in CPM;with call bell/phone within  reach Nurse Communication: Mobility status   GP     Yaroslav Gombos 09/09/2012, 12:46 PM

## 2012-09-09 NOTE — Progress Notes (Signed)
UR COMPLETED  

## 2012-09-09 NOTE — Progress Notes (Signed)
Subjective: 1 Day Post-Op Procedure(s) (LRB): COMPUTER ASSISTED TOTAL KNEE ARTHROPLASTY (Left) Patient reports pain as mild.    Objective: Vital signs in last 24 hours: Temp:  [97.4 F (36.3 C)-98.6 F (37 C)] 98 F (36.7 C) (02/04 0626) Pulse Rate:  [56-86] 86  (02/04 0626) Resp:  [11-20] 16  (02/04 0626) BP: (135-164)/(64-89) 139/67 mmHg (02/04 0626) SpO2:  [94 %-100 %] 94 % (02/04 0626) FiO2 (%):  [2 %] 2 % (02/03 2047)  Intake/Output from previous day: 02/03 0701 - 02/04 0700 In: 1700 [I.V.:1700] Out: 825 [Urine:750; Blood:75] Intake/Output this shift:     Basename 09/09/12 0415  HGB 11.8*    Basename 09/09/12 0415  WBC 7.4  RBC 4.04  HCT 35.6*  PLT 170    Basename 09/09/12 0415  NA 139  K 4.4  CL 102  CO2 30  BUN 10  CREATININE 0.69  GLUCOSE 141*  CALCIUM 8.9   No results found for this basename: LABPT:2,INR:2 in the last 72 hours  Neurologically intact  Assessment/Plan: 1 Day Post-Op Procedure(s) (LRB): COMPUTER ASSISTED TOTAL KNEE ARTHROPLASTY (Left) Up with therapy     Foley removed. Pain mild , proceed with therapy  Elmore Hyslop C 09/09/2012, 7:41 AM

## 2012-09-09 NOTE — Progress Notes (Signed)
Physical Therapy Treatment Patient Details Name: Cristina Barton MRN: [id number] DOB: [birth year] Today's Date: 09/09/2012 Time: 2694-8546 PT Time Calculation (min): 15 min  PT Assessment / Plan / Recommendation Comments on Treatment Session  Pt mobility continues to improve. Session shortened by pt need to use the restroom.     Follow Up Recommendations  SNF     Does the patient have the potential to tolerate intense rehabilitation     Barriers to Discharge        Equipment Recommendations  None recommended by PT    Recommendations for Other Services    Frequency 7X/week   Plan Discharge plan remains appropriate;Frequency remains appropriate    Precautions / Restrictions Precautions Precautions: Knee Precaution Booklet Issued: Yes (comment) Precaution Comments: Reviewed precautions with patient. Required Braces or Orthoses: Knee Immobilizer - Left Knee Immobilizer - Left: On except when in CPM;Discontinue once straight leg raise with < 10 degree lag Restrictions Weight Bearing Restrictions: Yes LLE Weight Bearing: Weight bearing as tolerated   Pertinent Vitals/Pain 5/10 pain in  L knee.  Pt medicated prior to session.     Mobility  Bed Mobility Bed Mobility: Not assessed Transfers Transfers: Sit to Stand;Stand to Sit Sit to Stand: 3: Mod assist;From chair/3-in-1;With upper extremity assist Stand to Sit: 4: Min assist;To bed;With upper extremity assist Details for Transfer Assistance: cueing for technique. Assist to steady pt secondary to pain and weakness in LLE.   Ambulation/Gait Ambulation/Gait Assistance: 4: Min guard Ambulation Distance (Feet): 30 Feet Assistive device: Rolling walker Ambulation/Gait Assistance Details: vcs for trunk posture, increased step length and increased wb on L LE>   Gait Pattern: Step-to pattern Stairs: No    Exercises     PT Diagnosis:    PT Problem List:   PT Treatment Interventions:     PT Goals Acute Rehab PT Goals PT Goal  Formulation: With patient Time For Goal Achievement: 09/15/12 Potential to Achieve Goals: Good Pt will go Supine/Side to Sit: Independently;with HOB 0 degrees Pt will go Sit to Supine/Side: Independently;with HOB 0 degrees Pt will go Sit to Stand: with supervision;with upper extremity assist PT Goal: Sit to Stand - Progress: Progressing toward goal Pt will Ambulate: 51 - 150 feet;with supervision;with rolling walker PT Goal: Ambulate - Progress: Progressing toward goal Pt will Perform Home Exercise Program: with supervision, verbal cues required/provided  Visit Information  Last PT Received On: 09/09/12 Assistance Needed: +2    Subjective Data  Subjective: I feel better todat Patient Stated Goal: to walk   Cognition  Cognition Overall Cognitive Status: Appears within functional limits for tasks assessed/performed Arousal/Alertness: Awake/alert Orientation Level: Appears intact for tasks assessed Behavior During Session: Lethargic    Balance  Balance Balance Assessed: No  End of Session PT - End of Session Equipment Utilized During Treatment: Left knee immobilizer;Oxygen;Gait belt Activity Tolerance: Patient tolerated treatment well Patient left: Other (comment);with family/visitor present;with call bell/phone within reach (in bathroom with call light. CNA aware.  ) Nurse Communication: Mobility status   GP     Cristina Barton 09/09/2012, 4:50 PM Cristina Barton DPT [phone]

## 2012-09-10 ENCOUNTER — Encounter (HOSPITAL_COMMUNITY): Payer: Self-pay | Admitting: General Practice

## 2012-09-10 LAB — CBC
HCT: 33.7 % — ABNORMAL LOW (ref 36.0–46.0)
Hemoglobin: 11 g/dL — ABNORMAL LOW (ref 12.0–15.0)
MCHC: 32.6 g/dL (ref 30.0–36.0)
RDW: 14.2 % (ref 11.5–15.5)
WBC: 8.9 10*3/uL (ref 4.0–10.5)

## 2012-09-10 NOTE — Clinical Social Work Psychosocial (Signed)
Clinical Social Work Department BRIEF PSYCHOSOCIAL ASSESSMENT 09/10/2012  Patient:  Cristina Barton, Cristina Barton     Account Number:  [id number]     Admit date:  09/08/2012  Clinical Social Worker:  Johnsie Cancel  Date/Time:  09/10/2012 12:00 M  Referred by:  Physician  Date Referred:  09/10/2012 Referred for  SNF Placement   Other Referral:   Interview type:  Patient Other interview type:    PSYCHOSOCIAL DATA Living Status:  ALONE   Primary support name:  [place] [phone]) Primary support relationship to patient:  [place] Degree of support available:   Adequate, at patient's bedside.    CURRENT CONCERNS Current Concerns  Post-Acute Placement   Other Concerns:    SOCIAL WORK ASSESSMENT / PLAN CSW consulted by MD re: SNF placement post surgery. CSW spoke to patient who stated she was agreeable to SNF. CSW provided support and education on the SNF process. CSW will fax the patient out and assist in d/c.   Assessment/plan status:  Information/Referral to [facility] Other assessment/ plan:   Information/referral to community resources:   SNF List.    PATIENT'S/FAMILY'S RESPONSE TO PLAN OF CARE: Patient thanked CSW for facilitating d/c and providing support.    Lia Foyer, LCSWA [facility] Clinical Social Worker Contact #: [phone]

## 2012-09-10 NOTE — Clinical Social Work Placement (Addendum)
Clinical Social Work Department CLINICAL SOCIAL WORK PLACEMENT NOTE 09/10/2012  Patient:  DEON, IVEY  Account Number:  [id number] Admit date:  09/08/2012  Clinical Social Worker:  Johnsie Cancel  Date/time:  09/10/2012 12:00 M  Clinical Social Work is seeking post-discharge placement for this patient at the following level of care:   SKILLED NURSING   (*CSW will update this form in Epic as items are completed)   09/10/2012  Patient/family provided with [facility] Department of Clinical Social Work's list of facilities offering this level of care within the geographic area requested by the patient (or if unable, by the patient's family).  09/10/2012  Patient/family informed of their freedom to choose among providers that offer the needed level of care, that participate in Medicare, Medicaid or managed care program needed by the patient, have an available bed and are willing to accept the patient.  09/10/2012  Patient/family informed of [facility]' ownership interest in [facility], as well as of the fact that they are under no obligation to receive care at this facility.  PASARR submitted to EDS on 09/10/2012 PASARR number received from EDS on 09/10/2012  FL2 transmitted to all facilities in geographic area requested by pt/family on  09/10/2012 FL2 transmitted to all facilities within larger geographic area on N/A  Patient informed that his/her managed care company has contracts with or will negotiate with  certain facilities, including the following:     Patient/family informed of bed offers received:  09/10/2012 [facility] Physician recommends and patient chooses bed at  N/A  Patient to be transferred to [facility] [place] on 09/11/2012 Patient to be transferred to facility by Family  The following physician request were entered in Epic:   Additional Comments:  Lia Foyer, LCSWA [facility] Clinical  Social Worker Contact #: [phone]

## 2012-09-10 NOTE — Progress Notes (Signed)
Physical Therapy Treatment Patient Details Name: Cristina Barton MRN: [id number] DOB: [birth year] Today's Date: 09/10/2012 Time: 1914-7829 PT Time Calculation (min): 24 min  PT Assessment / Plan / Recommendation Comments on Treatment Session  ROM and strength limited by pain. Pt unable to perform SLR secondary to knee extensor weakness.     Follow Up Recommendations  SNF     Does the patient have the potential to tolerate intense rehabilitation     Barriers to Discharge        Equipment Recommendations  None recommended by PT    Recommendations for Other Services    Frequency 7X/week   Plan Discharge plan remains appropriate;Frequency remains appropriate    Precautions / Restrictions Precautions Precautions: Knee Precaution Booklet Issued: Yes (comment) Precaution Comments: Reviewed precautions with patient. Required Braces or Orthoses: Knee Immobilizer - Left Knee Immobilizer - Left: On except when in CPM;Discontinue once straight leg raise with < 10 degree lag Restrictions Weight Bearing Restrictions: Yes LLE Weight Bearing: Weight bearing as tolerated   Pertinent Vitals/Pain 3-4/10 pain in knee.  Pt medicated prior to session.     Mobility  Bed Mobility Bed Mobility: Not assessed Transfers Transfers: Not assessed Ambulation/Gait Ambulation/Gait Assistance: Not tested (comment)    Exercises Total Joint Exercises Ankle Circles/Pumps: AROM;Both;10 reps;Supine Quad Sets: Left;10 reps;AROM Short Arc Quad: 5 reps;AAROM;Left Heel Slides: 15 reps;Left Hip ABduction/ADduction: 10 reps;Both;AROM;Supine Goniometric ROM: 0-65 degrees AAROM L Knee.    PT Diagnosis:    PT Problem List:   PT Treatment Interventions:     PT Goals Acute Rehab PT Goals PT Goal Formulation: With patient Time For Goal Achievement: 09/15/12 Potential to Achieve Goals: Good Pt will go Supine/Side to Sit: Independently;with HOB 0 degrees Pt will Perform Home Exercise Program: with supervision,  verbal cues required/provided PT Goal: Perform Home Exercise Program - Progress: Progressing toward goal  Visit Information  Last PT Received On: 09/10/12    Subjective Data  Subjective: Pain is not bad today.  Patient Stated Goal: to walk   Cognition  Cognition Overall Cognitive Status: Appears within functional limits for tasks assessed/performed Arousal/Alertness: Awake/alert Orientation Level: Appears intact for tasks assessed Behavior During Session: [facility] for tasks performed    Balance     End of Session PT - End of Session Activity Tolerance: Patient tolerated treatment well Patient left: in bed;in CPM;with call bell/phone within reach;with bed alarm set Nurse Communication: Mobility status CPM Left Knee CPM Left Knee: On Left Knee Flexion (Degrees): 50  Left Knee Extension (Degrees): 0  Additional Comments: CPM on at 9:40 am.    GP     Cristina Barton 09/10/2012, 11:50 AM Theron Arista L. Cristina Barton DPT [phone]

## 2012-09-10 NOTE — Progress Notes (Signed)
Physical Therapy Treatment Patient Details Name: Cristina Barton MRN: [id number] DOB: 08-01-1936 Today's Date: 09/10/2012 Time: 6440-3474 PT Time Calculation (min): 38 min  PT Assessment / Plan / Recommendation Comments on Treatment Session  Pt reports tolerating CPM well. Mobility continues to improve.      Follow Up Recommendations  SNF     Does the patient have the potential to tolerate intense rehabilitation     Barriers to Discharge        Equipment Recommendations  None recommended by PT    Recommendations for Other Services    Frequency 7X/week   Plan Discharge plan remains appropriate;Frequency remains appropriate    Precautions / Restrictions Precautions Precautions: Knee Precaution Booklet Issued: Yes (comment) Precaution Comments: Reviewed precautions with patient. Required Braces or Orthoses: Knee Immobilizer - Left Knee Immobilizer - Left: On except when in CPM;Discontinue once straight leg raise with < 10 degree lag Restrictions Weight Bearing Restrictions: Yes LLE Weight Bearing: Weight bearing as tolerated   Pertinent Vitals/Pain Pt reporting 5/10 pain in knee.  Applied ice to knee and notified RN.      Mobility  Bed Mobility Bed Mobility: Supine to Sit;Sitting - Scoot to Edge of Bed Supine to Sit: 4: Min assist;With rails;HOB elevated Sitting - Scoot to [place] of Bed: 4: Min assist;With rail Sit to Supine: Not Tested (comment) Details for Bed Mobility Assistance: Assist for LLE, cues for technique.  Transfers Transfers: Sit to Stand;Stand to Sit Sit to Stand: 4: Min assist;From bed;From chair/3-in-1;With upper extremity assist Stand to Sit: 4: Min assist;3: Mod assist;To chair/3-in-1;With upper extremity assist Details for Transfer Assistance: cueing for technique. Assist to steady pt secondary to pain and weakness in LLE.   Ambulation/Gait Ambulation/Gait Assistance: 5: Supervision;4: Min guard Ambulation Distance (Feet): 60 Feet Assistive device:  Rolling walker Ambulation/Gait Assistance Details: vcs to increase gait speed and decrease dependence on UEs. Vcs for upright trunk posture.  Gait Pattern: Step-to pattern Gait velocity: slow Stairs: No    Exercises Total Joint Exercises Straight Leg Raises: AAROM;5 reps;Left   PT Diagnosis:    PT Problem List:   PT Treatment Interventions:     PT Goals Acute Rehab PT Goals PT Goal Formulation: With patient Time For Goal Achievement: 09/15/12 Potential to Achieve Goals: Good Pt will go Supine/Side to Sit: Independently;with HOB 0 degrees PT Goal: Supine/Side to Sit - Progress: Progressing toward goal Pt will go Sit to Supine/Side: Independently;with HOB 0 degrees PT Goal: Sit to Supine/Side - Progress: Progressing toward goal Pt will go Sit to Stand: with supervision;with upper extremity assist PT Goal: Sit to Stand - Progress: Progressing toward goal Pt will Ambulate: 51 - 150 feet;with supervision;with rolling walker PT Goal: Ambulate - Progress: Progressing toward goal Pt will Perform Home Exercise Program: with supervision, verbal cues required/provided PT Goal: Perform Home Exercise Program - Progress: Progressing toward goal  Visit Information  Last PT Received On: 09/10/12 Assistance Needed: +2    Subjective Data  Subjective: Pain is not bad today.  Patient Stated Goal: to walk   Cognition  Cognition Overall Cognitive Status: Appears within functional limits for tasks assessed/performed Arousal/Alertness: Awake/alert Orientation Level: Appears intact for tasks assessed Behavior During Session: [facility] for tasks performed    Balance     End of Session PT - End of Session Equipment Utilized During Treatment: Left knee immobilizer;Oxygen;Gait belt Activity Tolerance: Patient tolerated treatment well Patient left: in chair;with call bell/phone within reach;with family/visitor present Nurse Communication: Mobility status CPM Left Knee  CPM Left Knee: On   GP      Cristina Barton 09/10/2012, 2:09 PM Cristina Barton L. Shontavia Mickel DPT [phone]

## 2012-09-10 NOTE — Progress Notes (Signed)
Subjective: 2 Days Post-Op Procedure(s) (LRB): COMPUTER ASSISTED TOTAL KNEE ARTHROPLASTY (Left) Patient reports pain as moderate.  Discussed discharge plans and she doesn't have any help at home.  Interested in short term NHP for rehab  Objective: Vital signs in last 24 hours: Temp:  [98.1 F (36.7 C)-98.6 F (37 C)] 98.1 F (36.7 C) (02/05 0628) Pulse Rate:  [88-91] 88  (02/05 0628) Resp:  [16-18] 18  (02/05 0628) BP: (130-140)/(62-64) 140/62 mmHg (02/05 0628) SpO2:  [96 %-99 %] 99 % (02/05 0628)  Intake/Output from previous day: 02/04 0701 - 02/05 0700 In: 480 [P.O.:480] Out: -  Intake/Output this shift:     Basename 09/10/12 0600 09/09/12 0415  HGB 11.0* 11.8*    Basename 09/10/12 0600 09/09/12 0415  WBC 8.9 7.4  RBC 3.82* 4.04  HCT 33.7* 35.6*  PLT 167 170    Basename 09/09/12 0415  NA 139  K 4.4  CL 102  CO2 30  BUN 10  CREATININE 0.69  GLUCOSE 141*  CALCIUM 8.9   No results found for this basename: LABPT:2,INR:2 in the last 72 hours  Neurovascular intact Sensation intact distally Intact pulses distally Dorsiflexion/Plantar flexion intact Incision: no drainage  Assessment/Plan: 2 Days Post-Op Procedure(s) (LRB): COMPUTER ASSISTED TOTAL KNEE ARTHROPLASTY (Left) Up with therapy Discharge to SNF if bed available tomorrow and she is stable.  Social work consult today for NHP  Wende Neighbors 09/10/2012, 12:49 PM

## 2012-09-11 DIAGNOSIS — E785 Hyperlipidemia, unspecified: Secondary | ICD-10-CM | POA: Diagnosis not present

## 2012-09-11 DIAGNOSIS — M1990 Unspecified osteoarthritis, unspecified site: Secondary | ICD-10-CM | POA: Diagnosis not present

## 2012-09-11 DIAGNOSIS — I10 Essential (primary) hypertension: Secondary | ICD-10-CM | POA: Diagnosis not present

## 2012-09-11 DIAGNOSIS — R269 Unspecified abnormalities of gait and mobility: Secondary | ICD-10-CM | POA: Diagnosis not present

## 2012-09-11 DIAGNOSIS — M6281 Muscle weakness (generalized): Secondary | ICD-10-CM | POA: Diagnosis not present

## 2012-09-11 DIAGNOSIS — M25669 Stiffness of unspecified knee, not elsewhere classified: Secondary | ICD-10-CM | POA: Diagnosis not present

## 2012-09-11 DIAGNOSIS — Z471 Aftercare following joint replacement surgery: Secondary | ICD-10-CM | POA: Diagnosis not present

## 2012-09-11 DIAGNOSIS — Z96659 Presence of unspecified artificial knee joint: Secondary | ICD-10-CM | POA: Diagnosis not present

## 2012-09-11 LAB — CBC
Hemoglobin: 10.6 g/dL — ABNORMAL LOW (ref 12.0–15.0)
MCH: 29.2 pg (ref 26.0–34.0)
MCV: 88.4 fL (ref 78.0–100.0)
Platelets: 160 10*3/uL (ref 150–400)
RBC: 3.63 MIL/uL — ABNORMAL LOW (ref 3.87–5.11)
WBC: 9.7 10*3/uL (ref 4.0–10.5)

## 2012-09-11 MED ORDER — DSS 100 MG PO CAPS: 100.0000 mg | ORAL_CAPSULE | Freq: Two times a day (BID) | ORAL | Status: DC

## 2012-09-11 MED ORDER — OXYCODONE-ACETAMINOPHEN 5-325 MG PO TABS: 1.0000 | ORAL_TABLET | ORAL | Status: DC | PRN

## 2012-09-11 MED ORDER — METHOCARBAMOL 500 MG PO TABS: 500.0000 mg | ORAL_TABLET | Freq: Four times a day (QID) | ORAL | Status: DC | PRN

## 2012-09-11 MED ORDER — BISACODYL 10 MG RE SUPP: 10.0000 mg | Freq: Every day | RECTAL | Status: DC | PRN

## 2012-09-11 MED ORDER — ASPIRIN 325 MG PO TBEC: 325.0000 mg | DELAYED_RELEASE_TABLET | Freq: Every day | ORAL | Status: DC

## 2012-09-11 MED ORDER — SENNOSIDES-DOCUSATE SODIUM 8.6-50 MG PO TABS: 1.0000 | ORAL_TABLET | Freq: Every evening | ORAL | Status: DC | PRN

## 2012-09-11 NOTE — Progress Notes (Signed)
Physical Therapy Treatment Patient Details Name: Cristina Barton MRN: [id number] DOB: March 24, 1936 Today's Date: 09/11/2012 Time: 4098-1191 PT Time Calculation (min): 47 min  PT Assessment / Plan / Recommendation Comments on Treatment Session  Pt to c/c to SNF today.  All further PT needs to be addressed in next venue of care.     Follow Up Recommendations  SNF     Does the patient have the potential to tolerate intense rehabilitation     Barriers to Discharge        Equipment Recommendations  None recommended by PT    Recommendations for Other Services    Frequency 7X/week   Plan Discharge plan remains appropriate;Frequency remains appropriate    Precautions / Restrictions Precautions Precautions: Knee Required Braces or Orthoses: Knee Immobilizer - Left Knee Immobilizer - Left: On except when in CPM;Discontinue once straight leg raise with < 10 degree lag Restrictions Weight Bearing Restrictions: Yes LLE Weight Bearing: Weight bearing as tolerated   Pertinent Vitals/Pain 5/10 pain in L knee.  Pt medicated 1 hour prior to session.     Mobility  Bed Mobility Bed Mobility: Not assessed Supine to Sit: 4: Min assist;HOB elevated;With rails Sitting - Scoot to Edge of Bed: 4: Min assist;With rail Sit to Supine: Not Tested (comment) Transfers Transfers: Sit to Stand;Stand to Sit Sit to Stand: 3: Mod assist;4: Min guard;From chair/3-in-1;With armrests;With upper extremity assist Stand to Sit: 4: Min assist;4: Min guard;To chair/3-in-1;With upper extremity assist Details for Transfer Assistance: Instructed pt to transfer into a low sitting car.  Assist to control descent to the seat,assist to lift LLE into/out of the car.  Mod assis to stand from low seat of the car with no armrest.  Also instructed pt to enter the car from driver side back seat and slide across the seat for LLE support.  Pt preferred backseat method.   Pt continues to require VC and manual facilitation to position and  manage LLE when attempting to sit.  Pt flops into the chair unsafely if not assisted/cued.   Ambulation/Gait Ambulation/Gait Assistance: 5: Supervision;4: Min guard Ambulation Distance (Feet): 50 Feet Assistive device: Rolling walker Ambulation/Gait Assistance Details: Vcs to decrease use of UEs and increase WB in LEs.   Gait Pattern: Step-to pattern Gait velocity: slow    Exercises     PT Diagnosis:    PT Problem List:   PT Treatment Interventions:     PT Goals Acute Rehab PT Goals PT Goal Formulation: With patient Time For Goal Achievement: 09/15/12 Potential to Achieve Goals: Good Pt will go Supine/Side to Sit: Independently;with HOB 0 degrees Pt will go Sit to Stand: with supervision;with upper extremity assist PT Goal: Sit to Stand - Progress: Progressing toward goal Pt will Ambulate: 51 - 150 feet;with supervision;with rolling walker PT Goal: Ambulate - Progress: Progressing toward goal Pt will Perform Home Exercise Program: with supervision, verbal cues required/provided PT Goal: Perform Home Exercise Program - Progress: Progressing toward goal  Visit Information  Last PT Received On: 09/11/12    Subjective Data  Subjective: I need to know how to get in and out of a car.  Patient Stated Goal: to walk   Cognition  Cognition Overall Cognitive Status: Appears within functional limits for tasks assessed/performed Arousal/Alertness: Awake/alert Orientation Level: Appears intact for tasks assessed Behavior During Session: [facility] for tasks performed    Balance  Balance Balance Assessed: No  End of Session PT - End of Session Equipment Utilized During Treatment: Gait belt;Left knee  immobilizer Activity Tolerance: Patient tolerated treatment well Patient left: in chair;with call bell/phone within reach;with family/visitor present Nurse Communication: Mobility status   GP     Jacobey Gura 09/11/2012, 12:16 PM Siegfried Vieth L. Ramonita Koenig DPT [phone]

## 2012-09-11 NOTE — Progress Notes (Signed)
Subjective: 3 Days Post-Op Procedure(s) (LRB): COMPUTER ASSISTED TOTAL KNEE ARTHROPLASTY (Left) Patient reports pain as mild. Comfortable.  Ready for discharge to NH today. Although she has not had BM and not much flatus, she is not nauseated and eating well.  She has infrequent BMs and is currently on colace She has active bowel sounds and her abdomen is without distention.  Objective: Vital signs in last 24 hours: Temp:  [98.3 F (36.8 C)-98.8 F (37.1 C)] 98.3 F (36.8 C) (02/06 0653) Pulse Rate:  [80-96] 94  (02/06 0653) Resp:  [18] 18  (02/06 0653) BP: (119-135)/(35-63) 126/63 mmHg (02/06 0653) SpO2:  [97 %-98 %] 98 % (02/06 0653) Weight:  [88.905 kg (196 lb)] 88.905 kg (196 lb) (02/05 2351)  Intake/Output from previous day: 02/05 0701 - 02/06 0700 In: -  Out: 240 [Urine:240] Intake/Output this shift:     Basename 09/11/12 0645 09/10/12 0600 09/09/12 0415  HGB 10.6* 11.0* 11.8*    Basename 09/11/12 0645 09/10/12 0600  WBC 9.7 8.9  RBC 3.63* 3.82*  HCT 32.1* 33.7*  PLT 160 167    Basename 09/09/12 0415  NA 139  K 4.4  CL 102  CO2 30  BUN 10  CREATININE 0.69  GLUCOSE 141*  CALCIUM 8.9   No results found for this basename: LABPT:2,INR:2 in the last 72 hours  Neurovascular intact Sensation intact distally Intact pulses distally Dorsiflexion/Plantar flexion intact Incision: no drainage  Assessment/Plan: 3 Days Post-Op Procedure(s) (LRB): COMPUTER ASSISTED TOTAL KNEE ARTHROPLASTY (Left) Discharge to SNF OV 2 weeks  Sophea Rackham M 09/11/2012, 8:53 AM

## 2012-09-11 NOTE — Clinical Social Work Note (Addendum)
CSW was consulted to complete discharge of patient. Pt to transfer to [facility] today via family. Facility and patient are aware of d/c. D/C packet complete with chart copy, signed FL2, and signed hard Rx.  CSW signing off as no other CSW needs identified at this time.  Lia Foyer, LCSWA [facility] Clinical Social Worker Contact #: [phone]

## 2012-09-11 NOTE — Evaluation (Signed)
Occupational Therapy Evaluation Patient Details Name: ANAYIA EUGENE MRN: [id number] DOB: 02-27-1936 Today's Date: 09/11/2012 Time: 4098-1191 OT Time Calculation (min): 36 min  OT Assessment / Plan / Recommendation Clinical Impression  Pt at mod A level for LB ADLs following L knee surgery. All education complete and no further acute OT services needed at this time. Recommend continued OT at SNF after acute care d/c, where pt plans to d/c    OT Assessment  All further OT needs can be met in the next venue of care    Follow Up Recommendations  SNF    Barriers to Discharge      Equipment Recommendations  Tub/shower seat;Other (comment) (TBD at SNF level of care)    Recommendations for Other Services    Frequency       Precautions / Restrictions Precautions Precautions: Knee;Fall Required Braces or Orthoses: Knee Immobilizer - Right Knee Immobilizer - Left: On except when in CPM;Discontinue once straight leg raise with < 10 degree lag Restrictions Weight Bearing Restrictions: Yes LLE Weight Bearing: Weight bearing as tolerated   Pertinent Vitals/Pain     ADL  Grooming: Performed;Wash/dry hands;Wash/dry face;Min guard Where Assessed - Grooming: Supported standing Upper Body Bathing: Performed;Supervision/safety;Set up Where Assessed - Upper Body Bathing: Unsupported sitting Lower Body Bathing: Performed;Moderate assistance Where Assessed - Lower Body Bathing: Unsupported sitting;Supported standing Upper Body Dressing: Performed;Supervision/safety;Set up Where Assessed - Upper Body Dressing: Unsupported sitting Lower Body Dressing: Performed;Moderate assistance Where Assessed - Lower Body Dressing: Unsupported sitting;Supported sit to stand;Supported standing Toilet Transfer: Performed;Min Pension scheme manager Method: Other (comment) (ambulating from RW level) Acupuncturist: Raised toilet seat with arms (or 3-in-1 over toilet);Grab bars Toileting - Clothing  Manipulation and Hygiene: Min guard Where Assessed - Engineer, mining and Hygiene: Standing Equipment Used: Rolling walker;Sock aid;Gait belt;Knee Immobilizer;Long-handled shoe horn;Long-handled sponge;Reacher;Other (comment) (3 in 1) Transfers/Ambulation Related to ADLs: Pt required verbal cues for correct hand placenemt and safe technique for stand - sit onto commode ADL Comments: Pt provided with education and demo of ADL A/E for use a home    OT Diagnosis: Generalized weakness  OT Problem List: Decreased strength;Decreased activity tolerance;Decreased safety awareness;Decreased knowledge of use of DME or AE;Impaired balance (sitting and/or standing);Decreased knowledge of precautions OT Treatment Interventions:     OT Goals    Visit Information  Last OT Received On: 09/11/12    Subjective Data  Subjective: " My sister will come pick me up if I can get into the car " Patient Stated Goal: To return home after therapy at SNF   Prior Functioning     Home Living Lives With: Alone Available Help at Discharge: Skilled Nursing Facility Type of Home: House Home Access: Stairs to enter [employer] of Steps: 1 Home Layout: One level Bathroom Shower/Tub: Health visitor: Standard Home Adaptive Equipment: Environmental consultant - rolling Prior Function Level of Independence: Independent Able to Take Stairs?: Yes Driving: Yes Vocation: Retired Musician: No difficulties Dominant Hand: Right         Vision/Perception Vision - History Baseline Vision: Wears glasses all the time Patient Visual Report: No change from baseline Vision - Assessment Eye Alignment: Within Functional Limits Perception Perception: Within Functional Limits   Cognition  Cognition Overall Cognitive Status: Appears within functional limits for tasks assessed/performed Arousal/Alertness: Awake/alert Orientation Level: Appears intact for tasks  assessed Behavior During Session: [facility] for tasks performed    Extremity/Trunk Assessment Right Upper Extremity Assessment RUE ROM/Strength/Tone: [facility] for tasks assessed Left  Upper Extremity Assessment LUE ROM/Strength/Tone: [facility] for tasks assessed     Mobility Bed Mobility Bed Mobility: Supine to Sit;Sitting - Scoot to Edge of Bed Supine to Sit: 4: Min assist;HOB elevated;With rails Sitting - Scoot to Edge of Bed: 4: Min assist;With rail Transfers Transfers: Sit to Stand;Stand to Sit Sit to Stand: 4: Min assist;From bed;From chair/3-in-1 Stand to Sit: 4: Min assist;To chair/3-in-1     Exercise     Balance Balance Balance Assessed: No   End of Session OT - End of Session Equipment Utilized During Treatment: Gait belt;Left knee immobilizer;Other (comment) (RW, 3 in 1, ADL A/E) Activity Tolerance: Patient tolerated treatment well Patient left: in chair;with call bell/phone within reach  GO     Galen Manila 09/11/2012, 11:35 AM

## 2012-09-11 NOTE — Discharge Summary (Signed)
Physician Discharge Summary  Patient ID: Cristina Barton MRN: [id number] DOB/AGE: 1935/11/30 77 y.o.  Admit date: 09/08/2012 Discharge date: 09/11/2012  Admission Diagnoses:  Osteoarthritis of left knee  Discharge Diagnoses:  Principal Problem:  *Osteoarthritis of left knee   Past Medical History  Diagnosis Date  . Hypertension     takes Verapamil nightly  . Hyperlipidemia     takes Pravastatin nightly  . Joint pain   . Psoriasis     methotrexate on Mondays-last dose 08/25/2012  . Early cataracts, bilateral   . Arthritis     "back";knee    Surgeries: Procedure(s): COMPUTER ASSISTED LEFT TOTAL KNEE ARTHROPLASTY on 09/08/2012   Consultants (if any):  NONE  Discharged Condition: Improved  Hospital Course: Cristina Barton is an 77 y.o. female who was admitted 09/08/2012 with a diagnosis of Osteoarthritis of left knee and went to the operating room on 09/08/2012 and underwent the above named procedures.    She was given perioperative antibiotics:  Anti-infectives     Start     Dose/Rate Route Frequency Ordered Stop   09/08/12 1900   ceFAZolin (ANCEF) IVPB 1 g/50 mL premix        1 g 100 mL/hr over 30 Minutes Intravenous Every 6 hours 09/08/12 1728 09/09/12 0059   09/08/12 0600   ceFAZolin (ANCEF) IVPB 2 g/50 mL premix        2 g 100 mL/hr over 30 Minutes Intravenous On call to O.R. 09/07/12 1217 09/08/12 1307        .  She was given sequential compression devices, early ambulation, and aspirin for DVT prophylaxis.  She benefited maximally from the hospital stay and there were no complications.    Recent vital signs:  Filed Vitals:   09/11/12 0653  BP: 126/63  Pulse: 94  Temp: 98.3 F (36.8 C)  Resp: 18    Recent laboratory studies:  Lab Results  Component Value Date   HGB 10.6* 09/11/2012   HGB 11.0* 09/10/2012   HGB 11.8* 09/09/2012   Lab Results  Component Value Date   WBC 9.7 09/11/2012   PLT 160 09/11/2012   Lab Results  Component Value Date   INR 1.04  09/01/2012   Lab Results  Component Value Date   NA 139 09/09/2012   K 4.4 09/09/2012   CL 102 09/09/2012   CO2 30 09/09/2012   BUN 10 09/09/2012   CREATININE 0.69 09/09/2012   GLUCOSE 141* 09/09/2012    Discharge Medications:     Medication List     As of 09/11/2012  9:26 AM    TAKE these medications         aspirin 325 MG EC tablet   Take 1 tablet (325 mg total) by mouth daily with breakfast.      bisacodyl 10 MG suppository   Commonly known as: DULCOLAX   Place 1 suppository (10 mg total) rectally daily as needed.      CALCIUM 600 + D PO   Take 1 tablet by mouth daily.      DSS 100 MG Caps   Take 100 mg by mouth 2 (two) times daily.      folic acid 1 MG tablet   Commonly known as: FOLVITE   Take 1 mg by mouth See admin instructions. Every day except Monday      methocarbamol 500 MG tablet   Commonly known as: ROBAXIN   Take 1 tablet (500 mg total) by mouth every 6 (six) hours as  needed.      methotrexate 2.5 MG tablet   Commonly known as: RHEUMATREX   Take 5 mg by mouth once a week. Mondays. Caution:Chemotherapy. Protect from light.      oxyCODONE-acetaminophen 5-325 MG per tablet   Commonly known as: PERCOCET/ROXICET   Take 1-2 tablets by mouth every 4 (four) hours as needed for pain.      pravastatin 40 MG tablet   Commonly known as: PRAVACHOL   Take 40 mg by mouth at bedtime.      senna-docusate 8.6-50 MG per tablet   Commonly known as: Senokot-S   Take 1 tablet by mouth at bedtime as needed.      verapamil 240 MG 24 hr capsule   Commonly known as: VERELAN PM   Take 240 mg by mouth at bedtime.        Diagnostic Studies: No results found.  Disposition: skilled nursing facility      Discharge Orders    Future Orders Please Complete By Expires   Diet - low sodium heart healthy      Call MD / Call 911      Comments:   If you experience chest pain or shortness of breath, CALL 911 and be transported to the hospital emergency room.  If you develope a fever  above 101 F, pus (white drainage) or increased drainage or redness at the wound, or calf pain, call your surgeon's office.   Constipation Prevention      Comments:   Drink plenty of fluids.  Prune juice may be helpful.  You may use a stool softener, such as Colace (over the counter) 100 mg twice a day.  Use MiraLax (over the counter) for constipation as needed.   Increase activity slowly as tolerated      Discharge instructions      Comments:   Keep knee incision dry for 5 days post op then may wet while bathing. Therapy daily and CPM goal full extension and greater than 90 degrees flexion. Call if fever or chills or increased drainage. Go to ER if acutely short of breath or call for ambulance. Return for follow up in 2 weeks. May full weight bear on the surgical leg unless told otherwise. Use knee immobilizer until able to straight leg raise off bed with knee stable. PT and OT daily for ambulation and gait training,weight bear as tolerated, ROM, strengthening and stretching exercises.   CPM      Comments:   Continuous passive motion machine (CPM):      Use the CPM from 0-as toletated(goal 90 degrees)  for 4-6 hours per day.      You may increase by 10 per day.  You may break it up into 2 or 3 sessions per day.      Use CPM for 2 weeks or until you are told to stop.   Do not put a pillow under the knee. Place it under the heel.      TED hose      Comments:   Use stockings (TED hose) for 4 weeks on bilateral leg(s).  remove them at night for sleeping.      Follow-up Information    Follow up with Eldred Manges, MD. Schedule an appointment as soon as possible for a visit in 2 weeks.   Contact information:   [address] [place] [address] [phone]           Signed: Wende Neighbors 09/11/2012, 9:26 AM

## 2012-09-11 NOTE — Plan of Care (Signed)
Problem: Phase II Progression Outcomes Goal: Discharge plan established Recommend SNF for further therapy needs after acute care d/c     

## 2012-09-27 DIAGNOSIS — J449 Chronic obstructive pulmonary disease, unspecified: Secondary | ICD-10-CM | POA: Diagnosis not present

## 2012-09-27 DIAGNOSIS — IMO0001 Reserved for inherently not codable concepts without codable children: Secondary | ICD-10-CM | POA: Diagnosis not present

## 2012-09-27 DIAGNOSIS — I10 Essential (primary) hypertension: Secondary | ICD-10-CM | POA: Diagnosis not present

## 2012-09-29 DIAGNOSIS — J449 Chronic obstructive pulmonary disease, unspecified: Secondary | ICD-10-CM | POA: Diagnosis not present

## 2012-09-29 DIAGNOSIS — IMO0001 Reserved for inherently not codable concepts without codable children: Secondary | ICD-10-CM | POA: Diagnosis not present

## 2012-09-30 DIAGNOSIS — IMO0001 Reserved for inherently not codable concepts without codable children: Secondary | ICD-10-CM | POA: Diagnosis not present

## 2012-09-30 DIAGNOSIS — J449 Chronic obstructive pulmonary disease, unspecified: Secondary | ICD-10-CM | POA: Diagnosis not present

## 2012-10-01 DIAGNOSIS — IMO0001 Reserved for inherently not codable concepts without codable children: Secondary | ICD-10-CM | POA: Diagnosis not present

## 2012-10-01 DIAGNOSIS — Z96659 Presence of unspecified artificial knee joint: Secondary | ICD-10-CM | POA: Diagnosis not present

## 2012-10-01 DIAGNOSIS — J449 Chronic obstructive pulmonary disease, unspecified: Secondary | ICD-10-CM | POA: Diagnosis not present

## 2012-10-01 DIAGNOSIS — I10 Essential (primary) hypertension: Secondary | ICD-10-CM | POA: Diagnosis not present

## 2012-10-02 DIAGNOSIS — Z96659 Presence of unspecified artificial knee joint: Secondary | ICD-10-CM | POA: Diagnosis not present

## 2012-10-03 DIAGNOSIS — Z96659 Presence of unspecified artificial knee joint: Secondary | ICD-10-CM | POA: Diagnosis not present

## 2012-10-03 DIAGNOSIS — J449 Chronic obstructive pulmonary disease, unspecified: Secondary | ICD-10-CM | POA: Diagnosis not present

## 2012-10-06 DIAGNOSIS — Z96659 Presence of unspecified artificial knee joint: Secondary | ICD-10-CM | POA: Diagnosis not present

## 2012-10-06 DIAGNOSIS — J449 Chronic obstructive pulmonary disease, unspecified: Secondary | ICD-10-CM | POA: Diagnosis not present

## 2012-10-08 DIAGNOSIS — J449 Chronic obstructive pulmonary disease, unspecified: Secondary | ICD-10-CM | POA: Diagnosis not present

## 2012-10-08 DIAGNOSIS — Z96659 Presence of unspecified artificial knee joint: Secondary | ICD-10-CM | POA: Diagnosis not present

## 2012-10-09 DIAGNOSIS — Z96659 Presence of unspecified artificial knee joint: Secondary | ICD-10-CM | POA: Diagnosis not present

## 2012-10-09 DIAGNOSIS — I10 Essential (primary) hypertension: Secondary | ICD-10-CM | POA: Diagnosis not present

## 2012-10-09 DIAGNOSIS — Z471 Aftercare following joint replacement surgery: Secondary | ICD-10-CM | POA: Diagnosis not present

## 2012-10-09 DIAGNOSIS — IMO0001 Reserved for inherently not codable concepts without codable children: Secondary | ICD-10-CM | POA: Diagnosis not present

## 2012-10-13 DIAGNOSIS — J449 Chronic obstructive pulmonary disease, unspecified: Secondary | ICD-10-CM | POA: Diagnosis not present

## 2012-10-13 DIAGNOSIS — Z96659 Presence of unspecified artificial knee joint: Secondary | ICD-10-CM | POA: Diagnosis not present

## 2012-10-15 DIAGNOSIS — Z96659 Presence of unspecified artificial knee joint: Secondary | ICD-10-CM | POA: Diagnosis not present

## 2012-10-17 DIAGNOSIS — IMO0001 Reserved for inherently not codable concepts without codable children: Secondary | ICD-10-CM | POA: Diagnosis not present

## 2012-10-17 DIAGNOSIS — Z96659 Presence of unspecified artificial knee joint: Secondary | ICD-10-CM | POA: Diagnosis not present

## 2012-10-17 DIAGNOSIS — J449 Chronic obstructive pulmonary disease, unspecified: Secondary | ICD-10-CM | POA: Diagnosis not present

## 2012-10-24 DIAGNOSIS — Z6834 Body mass index (BMI) 34.0-34.9, adult: Secondary | ICD-10-CM | POA: Diagnosis not present

## 2012-10-24 DIAGNOSIS — I10 Essential (primary) hypertension: Secondary | ICD-10-CM | POA: Diagnosis not present

## 2012-10-28 DIAGNOSIS — IMO0001 Reserved for inherently not codable concepts without codable children: Secondary | ICD-10-CM | POA: Diagnosis not present

## 2012-10-28 DIAGNOSIS — Z96659 Presence of unspecified artificial knee joint: Secondary | ICD-10-CM | POA: Diagnosis not present

## 2012-11-03 DIAGNOSIS — Z96659 Presence of unspecified artificial knee joint: Secondary | ICD-10-CM | POA: Diagnosis not present

## 2012-11-03 DIAGNOSIS — IMO0001 Reserved for inherently not codable concepts without codable children: Secondary | ICD-10-CM | POA: Diagnosis not present

## 2012-11-05 DIAGNOSIS — Z96659 Presence of unspecified artificial knee joint: Secondary | ICD-10-CM | POA: Diagnosis not present

## 2012-11-05 DIAGNOSIS — IMO0001 Reserved for inherently not codable concepts without codable children: Secondary | ICD-10-CM | POA: Diagnosis not present

## 2012-11-10 DIAGNOSIS — Z96659 Presence of unspecified artificial knee joint: Secondary | ICD-10-CM | POA: Diagnosis not present

## 2012-11-10 DIAGNOSIS — IMO0001 Reserved for inherently not codable concepts without codable children: Secondary | ICD-10-CM | POA: Diagnosis not present

## 2012-11-10 DIAGNOSIS — R52 Pain, unspecified: Secondary | ICD-10-CM | POA: Diagnosis not present

## 2012-11-12 DIAGNOSIS — R52 Pain, unspecified: Secondary | ICD-10-CM | POA: Diagnosis not present

## 2012-11-12 DIAGNOSIS — IMO0001 Reserved for inherently not codable concepts without codable children: Secondary | ICD-10-CM | POA: Diagnosis not present

## 2012-11-12 DIAGNOSIS — Z96659 Presence of unspecified artificial knee joint: Secondary | ICD-10-CM | POA: Diagnosis not present

## 2012-11-17 DIAGNOSIS — Z96659 Presence of unspecified artificial knee joint: Secondary | ICD-10-CM | POA: Diagnosis not present

## 2012-11-17 DIAGNOSIS — R52 Pain, unspecified: Secondary | ICD-10-CM | POA: Diagnosis not present

## 2012-11-17 DIAGNOSIS — IMO0001 Reserved for inherently not codable concepts without codable children: Secondary | ICD-10-CM | POA: Diagnosis not present

## 2012-11-19 DIAGNOSIS — R52 Pain, unspecified: Secondary | ICD-10-CM | POA: Diagnosis not present

## 2012-11-19 DIAGNOSIS — IMO0001 Reserved for inherently not codable concepts without codable children: Secondary | ICD-10-CM | POA: Diagnosis not present

## 2012-11-19 DIAGNOSIS — Z96659 Presence of unspecified artificial knee joint: Secondary | ICD-10-CM | POA: Diagnosis not present

## 2012-11-24 DIAGNOSIS — Z96659 Presence of unspecified artificial knee joint: Secondary | ICD-10-CM | POA: Diagnosis not present

## 2012-11-24 DIAGNOSIS — R52 Pain, unspecified: Secondary | ICD-10-CM | POA: Diagnosis not present

## 2012-11-24 DIAGNOSIS — IMO0001 Reserved for inherently not codable concepts without codable children: Secondary | ICD-10-CM | POA: Diagnosis not present

## 2012-12-01 DIAGNOSIS — R52 Pain, unspecified: Secondary | ICD-10-CM | POA: Diagnosis not present

## 2012-12-01 DIAGNOSIS — Z96659 Presence of unspecified artificial knee joint: Secondary | ICD-10-CM | POA: Diagnosis not present

## 2012-12-01 DIAGNOSIS — IMO0001 Reserved for inherently not codable concepts without codable children: Secondary | ICD-10-CM | POA: Diagnosis not present

## 2012-12-08 DIAGNOSIS — IMO0001 Reserved for inherently not codable concepts without codable children: Secondary | ICD-10-CM | POA: Diagnosis not present

## 2012-12-08 DIAGNOSIS — Z96659 Presence of unspecified artificial knee joint: Secondary | ICD-10-CM | POA: Diagnosis not present

## 2012-12-08 DIAGNOSIS — R52 Pain, unspecified: Secondary | ICD-10-CM | POA: Diagnosis not present

## 2012-12-15 DIAGNOSIS — R52 Pain, unspecified: Secondary | ICD-10-CM | POA: Diagnosis not present

## 2012-12-15 DIAGNOSIS — Z96659 Presence of unspecified artificial knee joint: Secondary | ICD-10-CM | POA: Diagnosis not present

## 2012-12-17 DIAGNOSIS — R52 Pain, unspecified: Secondary | ICD-10-CM | POA: Diagnosis not present

## 2012-12-17 DIAGNOSIS — Z96659 Presence of unspecified artificial knee joint: Secondary | ICD-10-CM | POA: Diagnosis not present

## 2012-12-22 DIAGNOSIS — Z96659 Presence of unspecified artificial knee joint: Secondary | ICD-10-CM | POA: Diagnosis not present

## 2012-12-22 DIAGNOSIS — R52 Pain, unspecified: Secondary | ICD-10-CM | POA: Diagnosis not present

## 2012-12-24 DIAGNOSIS — Z96659 Presence of unspecified artificial knee joint: Secondary | ICD-10-CM | POA: Diagnosis not present

## 2012-12-24 DIAGNOSIS — R52 Pain, unspecified: Secondary | ICD-10-CM | POA: Diagnosis not present

## 2012-12-30 DIAGNOSIS — R52 Pain, unspecified: Secondary | ICD-10-CM | POA: Diagnosis not present

## 2012-12-30 DIAGNOSIS — Z96659 Presence of unspecified artificial knee joint: Secondary | ICD-10-CM | POA: Diagnosis not present

## 2013-01-01 DIAGNOSIS — R52 Pain, unspecified: Secondary | ICD-10-CM | POA: Diagnosis not present

## 2013-01-01 DIAGNOSIS — Z96659 Presence of unspecified artificial knee joint: Secondary | ICD-10-CM | POA: Diagnosis not present

## 2013-03-03 ENCOUNTER — Other Ambulatory Visit: Payer: Self-pay | Admitting: Family Medicine

## 2013-03-03 DIAGNOSIS — E7800 Pure hypercholesterolemia, unspecified: Secondary | ICD-10-CM | POA: Diagnosis not present

## 2013-03-03 DIAGNOSIS — E2839 Other primary ovarian failure: Secondary | ICD-10-CM

## 2013-03-03 DIAGNOSIS — Z1231 Encounter for screening mammogram for malignant neoplasm of breast: Secondary | ICD-10-CM

## 2013-03-03 DIAGNOSIS — I10 Essential (primary) hypertension: Secondary | ICD-10-CM | POA: Diagnosis not present

## 2013-03-03 DIAGNOSIS — R7309 Other abnormal glucose: Secondary | ICD-10-CM | POA: Diagnosis not present

## 2013-03-09 DIAGNOSIS — Z96659 Presence of unspecified artificial knee joint: Secondary | ICD-10-CM | POA: Diagnosis not present

## 2013-03-09 DIAGNOSIS — M1710 Unilateral primary osteoarthritis, unspecified knee: Secondary | ICD-10-CM | POA: Diagnosis not present

## 2013-03-10 DIAGNOSIS — Z79899 Other long term (current) drug therapy: Secondary | ICD-10-CM | POA: Diagnosis not present

## 2013-03-10 DIAGNOSIS — L408 Other psoriasis: Secondary | ICD-10-CM | POA: Diagnosis not present

## 2013-06-01 ENCOUNTER — Ambulatory Visit
Admission: RE | Admit: 2013-06-01 | Discharge: 2013-06-01 | Disposition: A | Discharge status: Home / Self Care | Payer: Medicare Other | Source: Ambulatory Visit | Attending: Family Medicine | Admitting: Family Medicine

## 2013-06-01 DIAGNOSIS — Z1231 Encounter for screening mammogram for malignant neoplasm of breast: Secondary | ICD-10-CM | POA: Diagnosis not present

## 2013-06-01 DIAGNOSIS — E2839 Other primary ovarian failure: Secondary | ICD-10-CM

## 2013-06-01 DIAGNOSIS — Z780 Asymptomatic menopausal state: Secondary | ICD-10-CM | POA: Diagnosis not present

## 2013-06-03 DIAGNOSIS — Z23 Encounter for immunization: Secondary | ICD-10-CM | POA: Diagnosis not present

## 2013-07-06 IMAGING — CR DG CHEST 2V
2 series · 2 of 2 positions shown · non-contrast
Comparison: No priors.

CLINICAL DATA: Microdiskectomy and lumbar laminectomy.

CHEST - 2 VIEW

[view not recorded (1 of 2)]
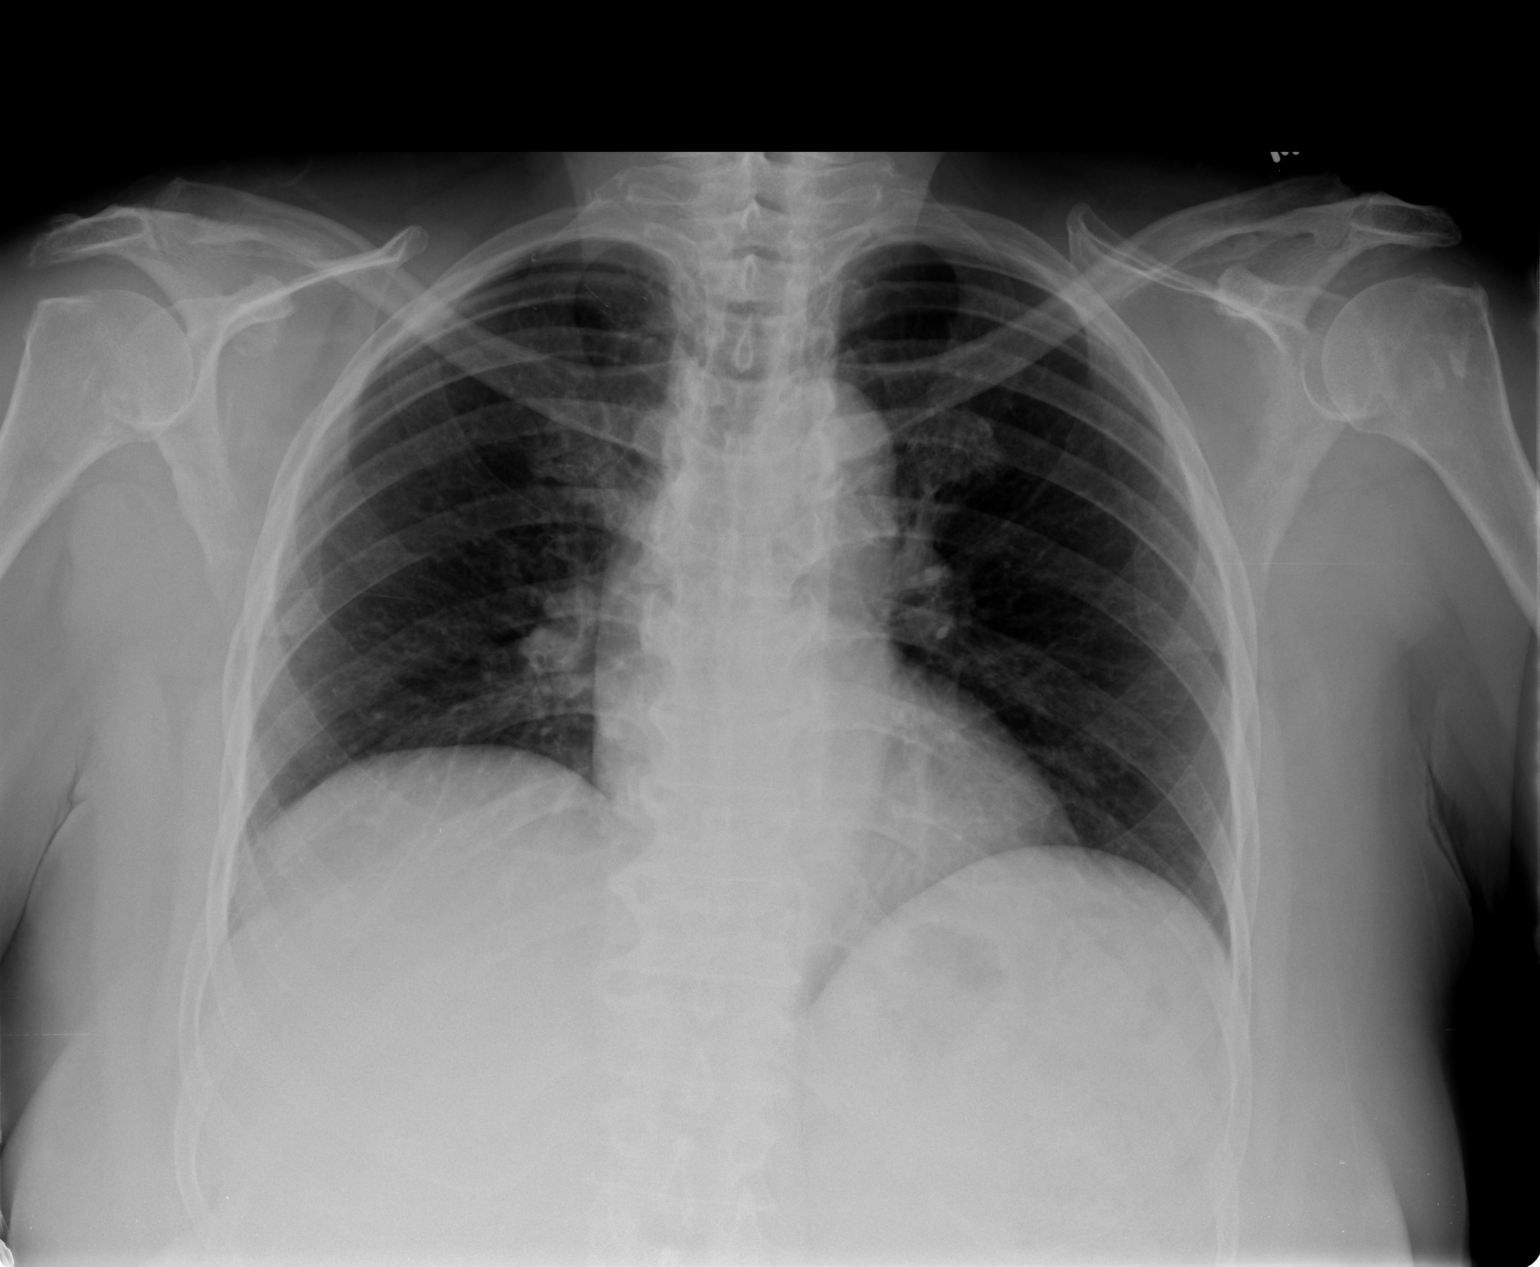

[view not recorded (2 of 2)]
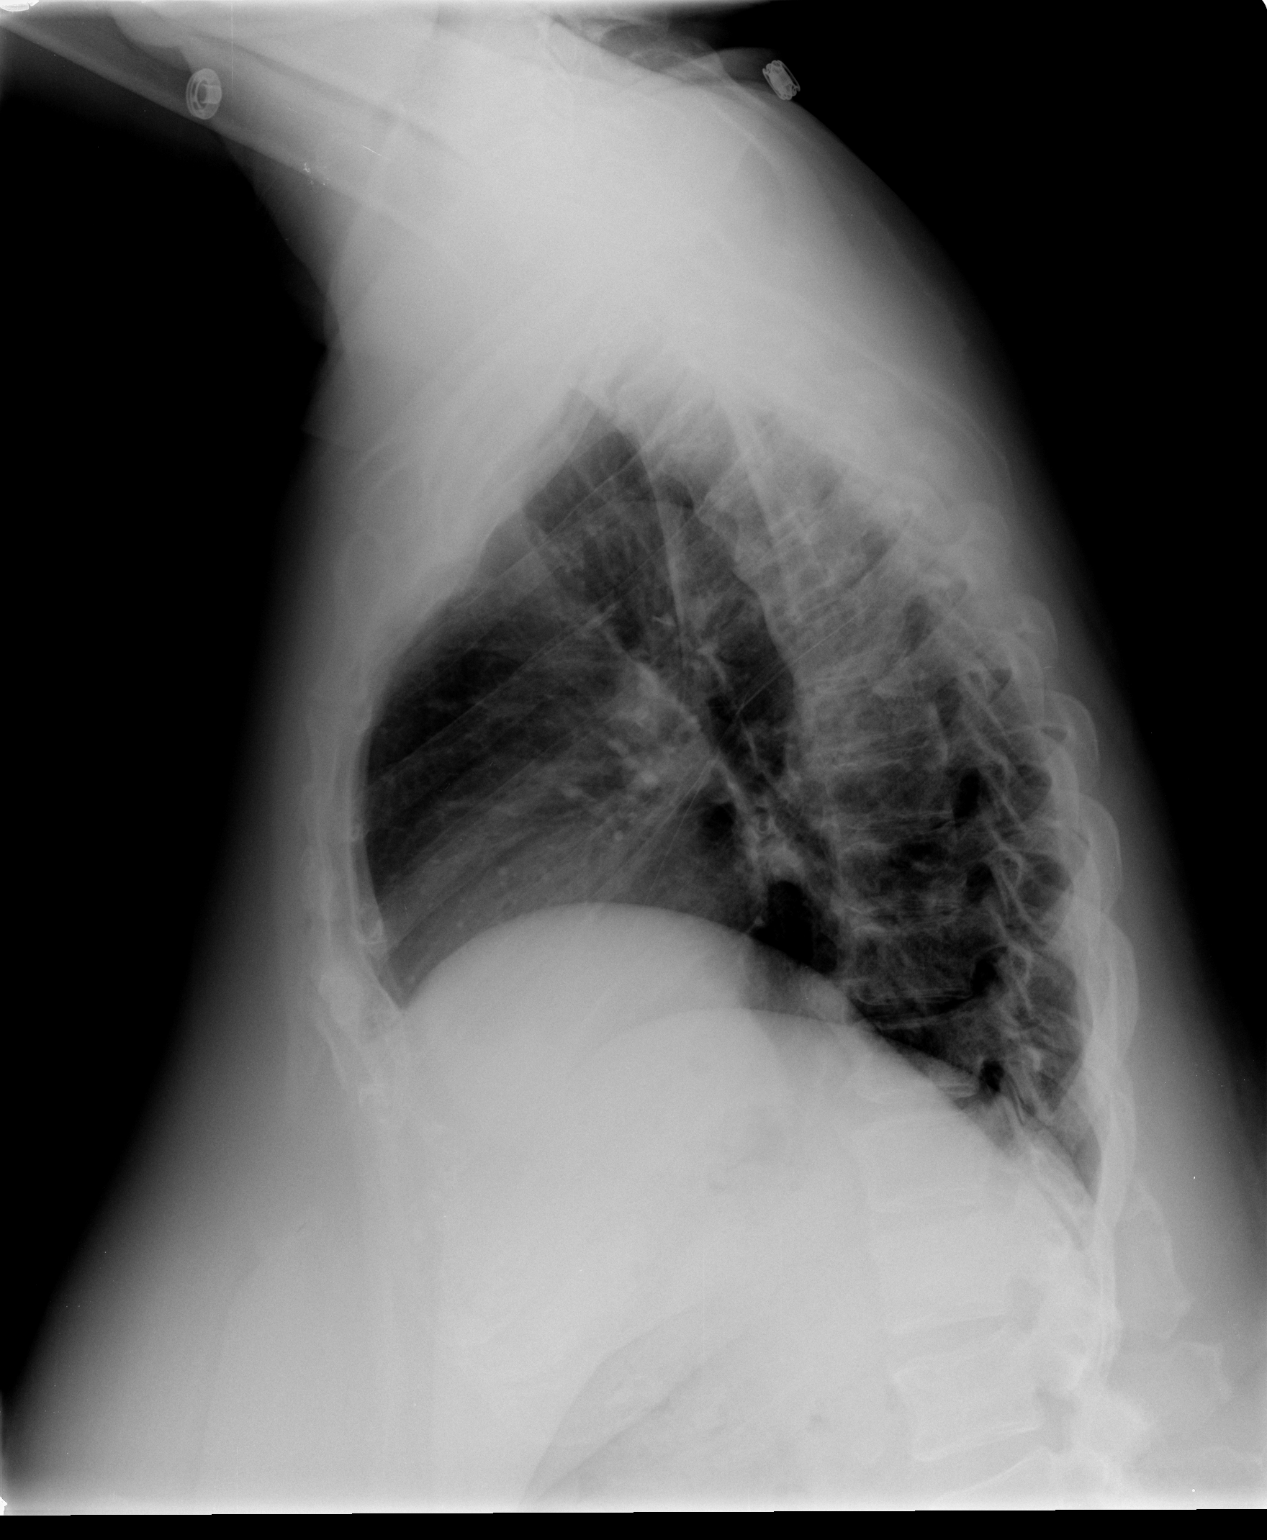

[2 of 2 positions shown; findings below may reference images not displayed]

FINDINGS: Lung volumes are low.  No consolidative airspace disease.
No pleural effusions.  No pneumothorax.  No pulmonary nodule or
mass noted.  Pulmonary vasculature and the cardiomediastinal
silhouette are within normal limits.
IMPRESSION: 1. No radiographic evidence of acute cardiopulmonary disease.

## 2013-07-07 IMAGING — CR DG LUMBAR SPINE 2-3V
1 series · 1 of 1 positions shown · non-contrast
Comparison: The lumbar MRI dated 03/03/2012

CLINICAL DATA: Spinal stenosis.

LUMBAR SPINE - 2-3 VIEW

[lateral]
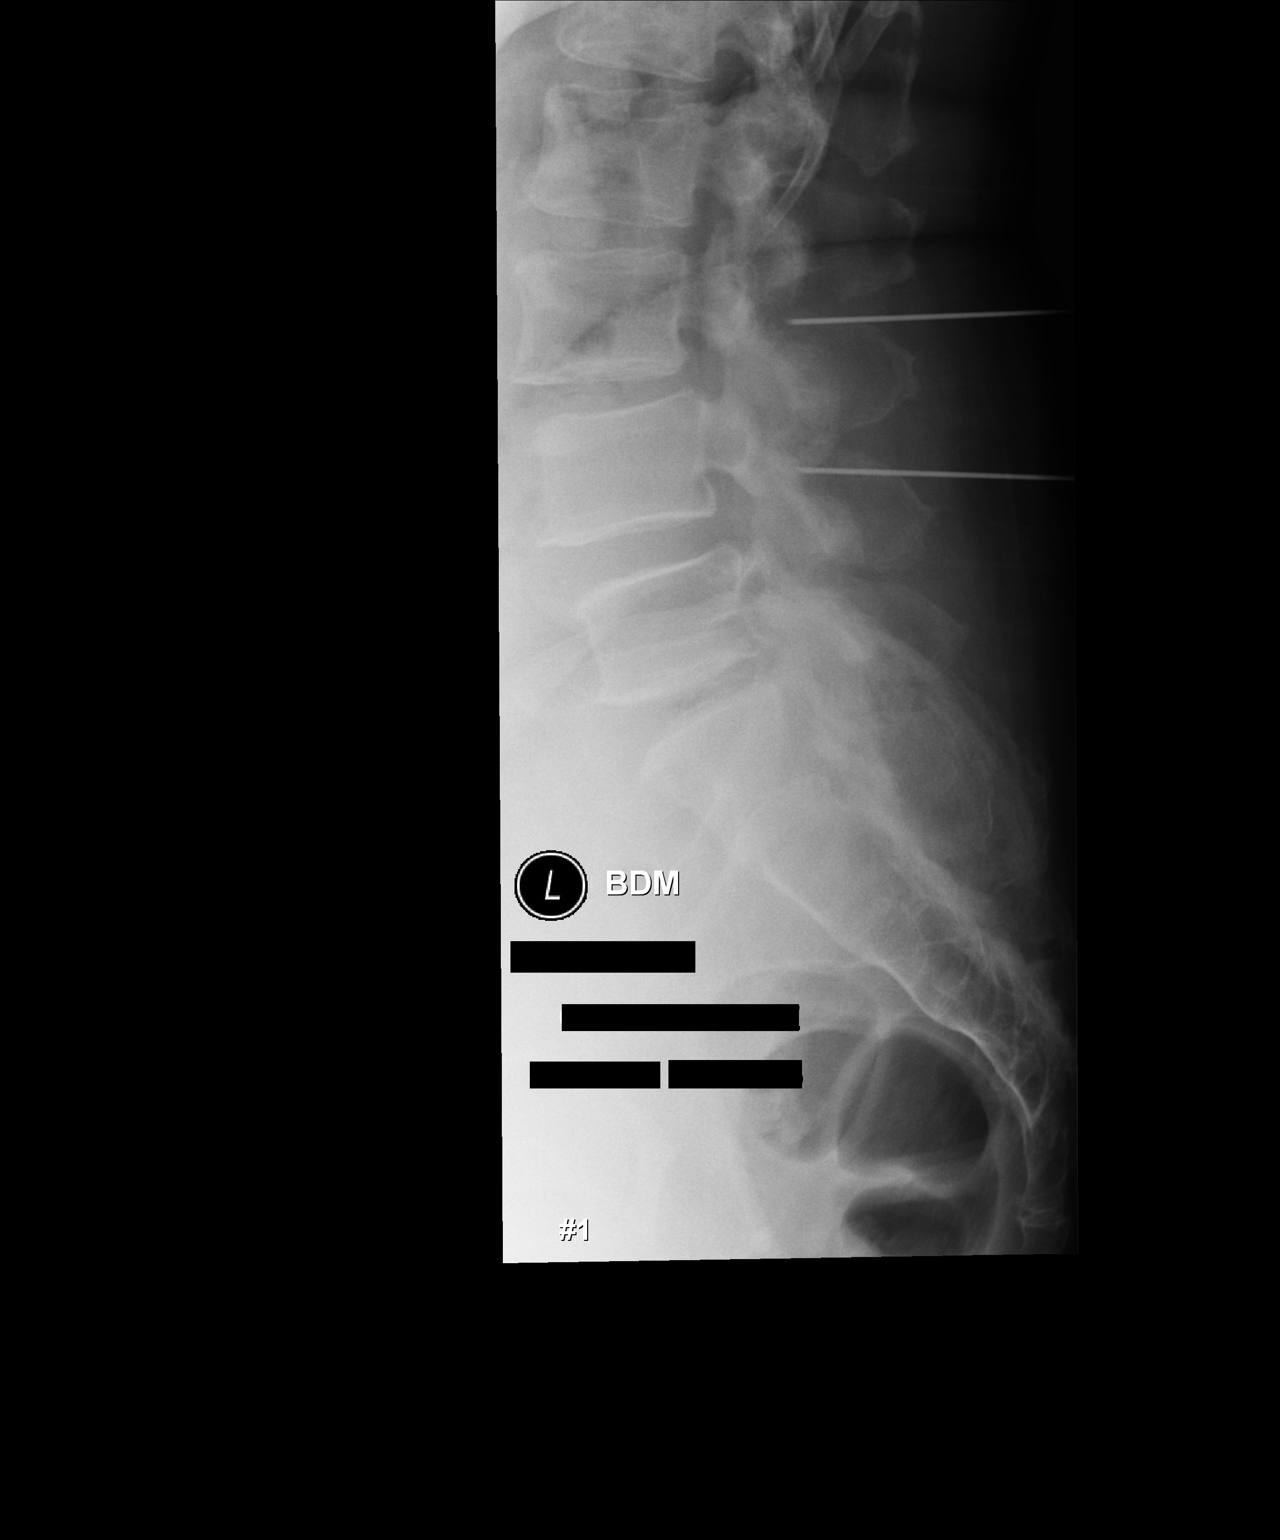

[1 of 1 positions shown; findings below may reference images not displayed]

FINDINGS: Image #1 demonstrates needles at the level of the lamina
of L3 and L4.  Radiograph #2 shows an instrument at the L4-5 level
and an instrument at the L3-4 level.
IMPRESSION: Instruments at L3-4 and L4-5.

## 2013-09-07 DIAGNOSIS — E7800 Pure hypercholesterolemia, unspecified: Secondary | ICD-10-CM | POA: Diagnosis not present

## 2013-09-07 DIAGNOSIS — Z1331 Encounter for screening for depression: Secondary | ICD-10-CM | POA: Diagnosis not present

## 2013-09-07 DIAGNOSIS — R7309 Other abnormal glucose: Secondary | ICD-10-CM | POA: Diagnosis not present

## 2013-09-07 DIAGNOSIS — Z9181 History of falling: Secondary | ICD-10-CM | POA: Diagnosis not present

## 2013-09-07 DIAGNOSIS — I10 Essential (primary) hypertension: Secondary | ICD-10-CM | POA: Diagnosis not present

## 2013-09-07 DIAGNOSIS — Z6834 Body mass index (BMI) 34.0-34.9, adult: Secondary | ICD-10-CM | POA: Diagnosis not present

## 2014-03-08 DIAGNOSIS — E7800 Pure hypercholesterolemia, unspecified: Secondary | ICD-10-CM | POA: Diagnosis not present

## 2014-03-08 DIAGNOSIS — I10 Essential (primary) hypertension: Secondary | ICD-10-CM | POA: Diagnosis not present

## 2014-03-08 DIAGNOSIS — R7309 Other abnormal glucose: Secondary | ICD-10-CM | POA: Diagnosis not present

## 2014-03-16 DIAGNOSIS — Z79899 Other long term (current) drug therapy: Secondary | ICD-10-CM | POA: Diagnosis not present

## 2014-03-16 DIAGNOSIS — L408 Other psoriasis: Secondary | ICD-10-CM | POA: Diagnosis not present

## 2014-04-15 DIAGNOSIS — J019 Acute sinusitis, unspecified: Secondary | ICD-10-CM | POA: Diagnosis not present

## 2014-04-30 ENCOUNTER — Other Ambulatory Visit: Payer: Self-pay | Admitting: Family Medicine

## 2014-04-30 DIAGNOSIS — Z23 Encounter for immunization: Secondary | ICD-10-CM | POA: Diagnosis not present

## 2014-04-30 DIAGNOSIS — R51 Headache: Secondary | ICD-10-CM | POA: Diagnosis not present

## 2014-04-30 DIAGNOSIS — Z1231 Encounter for screening mammogram for malignant neoplasm of breast: Secondary | ICD-10-CM

## 2014-06-02 ENCOUNTER — Ambulatory Visit
Admission: RE | Admit: 2014-06-02 | Discharge: 2014-06-02 | Disposition: A | Discharge status: Home / Self Care | Payer: Medicare Other | Source: Ambulatory Visit | Attending: Family Medicine | Admitting: Family Medicine

## 2014-06-02 DIAGNOSIS — Z1231 Encounter for screening mammogram for malignant neoplasm of breast: Secondary | ICD-10-CM | POA: Diagnosis not present

## 2014-09-13 DIAGNOSIS — Z6834 Body mass index (BMI) 34.0-34.9, adult: Secondary | ICD-10-CM | POA: Diagnosis not present

## 2014-09-13 DIAGNOSIS — Z9181 History of falling: Secondary | ICD-10-CM | POA: Diagnosis not present

## 2014-09-13 DIAGNOSIS — E780 Pure hypercholesterolemia: Secondary | ICD-10-CM | POA: Diagnosis not present

## 2014-09-13 DIAGNOSIS — I10 Essential (primary) hypertension: Secondary | ICD-10-CM | POA: Diagnosis not present

## 2014-09-13 DIAGNOSIS — R7309 Other abnormal glucose: Secondary | ICD-10-CM | POA: Diagnosis not present

## 2014-09-13 DIAGNOSIS — Z23 Encounter for immunization: Secondary | ICD-10-CM | POA: Diagnosis not present

## 2014-09-13 DIAGNOSIS — Z79899 Other long term (current) drug therapy: Secondary | ICD-10-CM | POA: Diagnosis not present

## 2014-09-13 DIAGNOSIS — Z1389 Encounter for screening for other disorder: Secondary | ICD-10-CM | POA: Diagnosis not present

## 2014-10-05 DIAGNOSIS — L409 Psoriasis, unspecified: Secondary | ICD-10-CM | POA: Diagnosis not present

## 2014-10-05 DIAGNOSIS — Z5181 Encounter for therapeutic drug level monitoring: Secondary | ICD-10-CM | POA: Diagnosis not present

## 2015-03-15 DIAGNOSIS — Z6835 Body mass index (BMI) 35.0-35.9, adult: Secondary | ICD-10-CM | POA: Diagnosis not present

## 2015-03-15 DIAGNOSIS — E780 Pure hypercholesterolemia: Secondary | ICD-10-CM | POA: Diagnosis not present

## 2015-03-15 DIAGNOSIS — Z9181 History of falling: Secondary | ICD-10-CM | POA: Diagnosis not present

## 2015-03-15 DIAGNOSIS — R7309 Other abnormal glucose: Secondary | ICD-10-CM | POA: Diagnosis not present

## 2015-03-15 DIAGNOSIS — Z1389 Encounter for screening for other disorder: Secondary | ICD-10-CM | POA: Diagnosis not present

## 2015-03-15 DIAGNOSIS — I10 Essential (primary) hypertension: Secondary | ICD-10-CM | POA: Diagnosis not present

## 2015-05-05 DIAGNOSIS — L409 Psoriasis, unspecified: Secondary | ICD-10-CM | POA: Diagnosis not present

## 2015-05-05 DIAGNOSIS — Z5181 Encounter for therapeutic drug level monitoring: Secondary | ICD-10-CM | POA: Diagnosis not present

## 2015-05-05 DIAGNOSIS — Z79899 Other long term (current) drug therapy: Secondary | ICD-10-CM | POA: Diagnosis not present

## 2015-05-11 ENCOUNTER — Other Ambulatory Visit: Payer: Self-pay

## 2015-05-11 DIAGNOSIS — Z1231 Encounter for screening mammogram for malignant neoplasm of breast: Secondary | ICD-10-CM

## 2015-06-08 DIAGNOSIS — Z23 Encounter for immunization: Secondary | ICD-10-CM | POA: Diagnosis not present

## 2015-06-10 ENCOUNTER — Ambulatory Visit
Admission: RE | Admit: 2015-06-10 | Discharge: 2015-06-10 | Disposition: A | Discharge status: Home / Self Care | Payer: Medicare Other | Source: Ambulatory Visit

## 2015-06-10 DIAGNOSIS — Z1231 Encounter for screening mammogram for malignant neoplasm of breast: Secondary | ICD-10-CM

## 2015-09-22 DIAGNOSIS — Z79899 Other long term (current) drug therapy: Secondary | ICD-10-CM | POA: Diagnosis not present

## 2015-09-22 DIAGNOSIS — R7303 Prediabetes: Secondary | ICD-10-CM | POA: Diagnosis not present

## 2015-09-22 DIAGNOSIS — E669 Obesity, unspecified: Secondary | ICD-10-CM | POA: Diagnosis not present

## 2015-09-22 DIAGNOSIS — R0982 Postnasal drip: Secondary | ICD-10-CM | POA: Diagnosis not present

## 2015-09-22 DIAGNOSIS — E7800 Pure hypercholesterolemia, unspecified: Secondary | ICD-10-CM | POA: Diagnosis not present

## 2015-09-22 DIAGNOSIS — Z6835 Body mass index (BMI) 35.0-35.9, adult: Secondary | ICD-10-CM | POA: Diagnosis not present

## 2015-09-22 DIAGNOSIS — I10 Essential (primary) hypertension: Secondary | ICD-10-CM | POA: Diagnosis not present

## 2016-01-03 DIAGNOSIS — Z79899 Other long term (current) drug therapy: Secondary | ICD-10-CM | POA: Diagnosis not present

## 2016-01-03 DIAGNOSIS — Z5181 Encounter for therapeutic drug level monitoring: Secondary | ICD-10-CM | POA: Diagnosis not present

## 2016-01-03 DIAGNOSIS — L409 Psoriasis, unspecified: Secondary | ICD-10-CM | POA: Diagnosis not present

## 2016-01-05 DIAGNOSIS — E669 Obesity, unspecified: Secondary | ICD-10-CM | POA: Diagnosis not present

## 2016-01-05 DIAGNOSIS — Z9189 Other specified personal risk factors, not elsewhere classified: Secondary | ICD-10-CM | POA: Diagnosis not present

## 2016-01-05 DIAGNOSIS — I10 Essential (primary) hypertension: Secondary | ICD-10-CM | POA: Diagnosis not present

## 2016-01-05 DIAGNOSIS — Z6836 Body mass index (BMI) 36.0-36.9, adult: Secondary | ICD-10-CM | POA: Diagnosis not present

## 2016-02-02 DIAGNOSIS — R7303 Prediabetes: Secondary | ICD-10-CM | POA: Diagnosis not present

## 2016-02-02 DIAGNOSIS — I10 Essential (primary) hypertension: Secondary | ICD-10-CM | POA: Diagnosis not present

## 2016-02-02 DIAGNOSIS — Z6836 Body mass index (BMI) 36.0-36.9, adult: Secondary | ICD-10-CM | POA: Diagnosis not present

## 2016-02-02 DIAGNOSIS — E7800 Pure hypercholesterolemia, unspecified: Secondary | ICD-10-CM | POA: Diagnosis not present

## 2016-02-02 DIAGNOSIS — Z79899 Other long term (current) drug therapy: Secondary | ICD-10-CM | POA: Diagnosis not present

## 2016-06-13 DIAGNOSIS — Z23 Encounter for immunization: Secondary | ICD-10-CM | POA: Diagnosis not present

## 2016-07-06 DIAGNOSIS — L409 Psoriasis, unspecified: Secondary | ICD-10-CM | POA: Diagnosis not present

## 2016-07-06 DIAGNOSIS — Z5181 Encounter for therapeutic drug level monitoring: Secondary | ICD-10-CM | POA: Diagnosis not present

## 2016-07-06 DIAGNOSIS — Z79899 Other long term (current) drug therapy: Secondary | ICD-10-CM | POA: Diagnosis not present

## 2016-08-03 DIAGNOSIS — Z1389 Encounter for screening for other disorder: Secondary | ICD-10-CM | POA: Diagnosis not present

## 2016-08-03 DIAGNOSIS — R7303 Prediabetes: Secondary | ICD-10-CM | POA: Diagnosis not present

## 2016-08-03 DIAGNOSIS — Z9181 History of falling: Secondary | ICD-10-CM | POA: Diagnosis not present

## 2016-08-03 DIAGNOSIS — I10 Essential (primary) hypertension: Secondary | ICD-10-CM | POA: Diagnosis not present

## 2016-08-03 DIAGNOSIS — R197 Diarrhea, unspecified: Secondary | ICD-10-CM | POA: Diagnosis not present

## 2016-08-03 DIAGNOSIS — E7800 Pure hypercholesterolemia, unspecified: Secondary | ICD-10-CM | POA: Diagnosis not present

## 2016-08-03 DIAGNOSIS — L409 Psoriasis, unspecified: Secondary | ICD-10-CM | POA: Diagnosis not present

## 2016-08-17 DIAGNOSIS — R748 Abnormal levels of other serum enzymes: Secondary | ICD-10-CM | POA: Diagnosis not present

## 2016-12-18 DIAGNOSIS — Z79899 Other long term (current) drug therapy: Secondary | ICD-10-CM | POA: Diagnosis not present

## 2016-12-18 DIAGNOSIS — Z5181 Encounter for therapeutic drug level monitoring: Secondary | ICD-10-CM | POA: Diagnosis not present

## 2016-12-18 DIAGNOSIS — L409 Psoriasis, unspecified: Secondary | ICD-10-CM | POA: Diagnosis not present

## 2017-01-14 DIAGNOSIS — R7303 Prediabetes: Secondary | ICD-10-CM | POA: Diagnosis not present

## 2017-01-14 DIAGNOSIS — E7800 Pure hypercholesterolemia, unspecified: Secondary | ICD-10-CM | POA: Diagnosis not present

## 2017-01-14 DIAGNOSIS — Z6834 Body mass index (BMI) 34.0-34.9, adult: Secondary | ICD-10-CM | POA: Diagnosis not present

## 2017-01-14 DIAGNOSIS — Z79899 Other long term (current) drug therapy: Secondary | ICD-10-CM | POA: Diagnosis not present

## 2017-01-14 DIAGNOSIS — I10 Essential (primary) hypertension: Secondary | ICD-10-CM | POA: Diagnosis not present

## 2017-01-21 DIAGNOSIS — Z1231 Encounter for screening mammogram for malignant neoplasm of breast: Secondary | ICD-10-CM | POA: Diagnosis not present

## 2017-01-21 DIAGNOSIS — Z136 Encounter for screening for cardiovascular disorders: Secondary | ICD-10-CM | POA: Diagnosis not present

## 2017-01-21 DIAGNOSIS — E785 Hyperlipidemia, unspecified: Secondary | ICD-10-CM | POA: Diagnosis not present

## 2017-01-21 DIAGNOSIS — Z9181 History of falling: Secondary | ICD-10-CM | POA: Diagnosis not present

## 2017-01-21 DIAGNOSIS — Z1389 Encounter for screening for other disorder: Secondary | ICD-10-CM | POA: Diagnosis not present

## 2017-01-21 DIAGNOSIS — Z0000 Encounter for general adult medical examination without abnormal findings: Secondary | ICD-10-CM | POA: Diagnosis not present

## 2017-01-21 DIAGNOSIS — Z6833 Body mass index (BMI) 33.0-33.9, adult: Secondary | ICD-10-CM | POA: Diagnosis not present

## 2017-01-23 ENCOUNTER — Other Ambulatory Visit: Payer: Self-pay | Admitting: Physician Assistant

## 2017-01-23 DIAGNOSIS — Z1231 Encounter for screening mammogram for malignant neoplasm of breast: Secondary | ICD-10-CM

## 2017-02-08 ENCOUNTER — Ambulatory Visit
Admission: RE | Admit: 2017-02-08 | Discharge: 2017-02-08 | Disposition: A | Discharge status: Home / Self Care | Payer: Medicare Other | Source: Ambulatory Visit | Attending: Physician Assistant | Admitting: Physician Assistant

## 2017-02-08 DIAGNOSIS — Z1231 Encounter for screening mammogram for malignant neoplasm of breast: Secondary | ICD-10-CM | POA: Diagnosis not present

## 2017-02-11 ENCOUNTER — Other Ambulatory Visit: Payer: Self-pay | Admitting: Physician Assistant

## 2017-02-11 DIAGNOSIS — R928 Other abnormal and inconclusive findings on diagnostic imaging of breast: Secondary | ICD-10-CM

## 2017-02-13 ENCOUNTER — Ambulatory Visit
Admission: RE | Admit: 2017-02-13 | Discharge: 2017-02-13 | Disposition: A | Discharge status: Home / Self Care | Payer: Medicare Other | Source: Ambulatory Visit | Attending: Physician Assistant | Admitting: Physician Assistant

## 2017-02-13 ENCOUNTER — Other Ambulatory Visit: Payer: Medicare Other

## 2017-02-13 DIAGNOSIS — N6489 Other specified disorders of breast: Secondary | ICD-10-CM | POA: Diagnosis not present

## 2017-02-13 DIAGNOSIS — R928 Other abnormal and inconclusive findings on diagnostic imaging of breast: Secondary | ICD-10-CM

## 2017-02-14 ENCOUNTER — Other Ambulatory Visit: Payer: Self-pay | Admitting: Physician Assistant

## 2017-02-14 DIAGNOSIS — N6002 Solitary cyst of left breast: Secondary | ICD-10-CM

## 2017-06-12 DIAGNOSIS — Z23 Encounter for immunization: Secondary | ICD-10-CM | POA: Diagnosis not present

## 2017-06-21 DIAGNOSIS — Z5181 Encounter for therapeutic drug level monitoring: Secondary | ICD-10-CM | POA: Diagnosis not present

## 2017-06-21 DIAGNOSIS — L409 Psoriasis, unspecified: Secondary | ICD-10-CM | POA: Diagnosis not present

## 2017-06-21 DIAGNOSIS — I10 Essential (primary) hypertension: Secondary | ICD-10-CM | POA: Insufficient documentation

## 2017-06-21 DIAGNOSIS — Z79899 Other long term (current) drug therapy: Secondary | ICD-10-CM | POA: Diagnosis not present

## 2017-07-18 DIAGNOSIS — I10 Essential (primary) hypertension: Secondary | ICD-10-CM | POA: Diagnosis not present

## 2017-07-18 DIAGNOSIS — R7303 Prediabetes: Secondary | ICD-10-CM | POA: Diagnosis not present

## 2017-07-18 DIAGNOSIS — E7800 Pure hypercholesterolemia, unspecified: Secondary | ICD-10-CM | POA: Diagnosis not present

## 2017-08-19 ENCOUNTER — Inpatient Hospital Stay
Admission: RE | Admit: 2017-08-19 | Discharge: 2017-08-19 | Disposition: A | Discharge status: Home / Self Care | Payer: Medicare Other | Source: Ambulatory Visit | Attending: Physician Assistant | Admitting: Physician Assistant

## 2017-12-27 DIAGNOSIS — Z5181 Encounter for therapeutic drug level monitoring: Secondary | ICD-10-CM | POA: Diagnosis not present

## 2017-12-27 DIAGNOSIS — Z79899 Other long term (current) drug therapy: Secondary | ICD-10-CM | POA: Diagnosis not present

## 2017-12-27 DIAGNOSIS — L409 Psoriasis, unspecified: Secondary | ICD-10-CM | POA: Diagnosis not present

## 2018-01-16 DIAGNOSIS — I10 Essential (primary) hypertension: Secondary | ICD-10-CM | POA: Diagnosis not present

## 2018-01-16 DIAGNOSIS — L409 Psoriasis, unspecified: Secondary | ICD-10-CM | POA: Diagnosis not present

## 2018-01-16 DIAGNOSIS — E7800 Pure hypercholesterolemia, unspecified: Secondary | ICD-10-CM | POA: Diagnosis not present

## 2018-05-20 DIAGNOSIS — Z23 Encounter for immunization: Secondary | ICD-10-CM | POA: Diagnosis not present

## 2018-06-25 ENCOUNTER — Other Ambulatory Visit: Payer: Self-pay

## 2018-07-21 DIAGNOSIS — Z9181 History of falling: Secondary | ICD-10-CM | POA: Diagnosis not present

## 2018-07-21 DIAGNOSIS — Z79899 Other long term (current) drug therapy: Secondary | ICD-10-CM | POA: Diagnosis not present

## 2018-07-21 DIAGNOSIS — Z1331 Encounter for screening for depression: Secondary | ICD-10-CM | POA: Diagnosis not present

## 2018-07-21 DIAGNOSIS — I10 Essential (primary) hypertension: Secondary | ICD-10-CM | POA: Diagnosis not present

## 2018-07-21 DIAGNOSIS — E7800 Pure hypercholesterolemia, unspecified: Secondary | ICD-10-CM | POA: Diagnosis not present

## 2018-07-21 DIAGNOSIS — L409 Psoriasis, unspecified: Secondary | ICD-10-CM | POA: Diagnosis not present

## 2018-07-21 DIAGNOSIS — R7303 Prediabetes: Secondary | ICD-10-CM | POA: Diagnosis not present

## 2018-07-22 DIAGNOSIS — Z5181 Encounter for therapeutic drug level monitoring: Secondary | ICD-10-CM | POA: Diagnosis not present

## 2018-07-22 DIAGNOSIS — Z79899 Other long term (current) drug therapy: Secondary | ICD-10-CM | POA: Diagnosis not present

## 2018-07-22 DIAGNOSIS — L409 Psoriasis, unspecified: Secondary | ICD-10-CM | POA: Diagnosis not present

## 2018-11-06 DIAGNOSIS — E785 Hyperlipidemia, unspecified: Secondary | ICD-10-CM | POA: Diagnosis not present

## 2018-11-06 DIAGNOSIS — Z6831 Body mass index (BMI) 31.0-31.9, adult: Secondary | ICD-10-CM | POA: Diagnosis not present

## 2018-11-06 DIAGNOSIS — Z0000 Encounter for general adult medical examination without abnormal findings: Secondary | ICD-10-CM | POA: Diagnosis not present

## 2019-01-20 DIAGNOSIS — L409 Psoriasis, unspecified: Secondary | ICD-10-CM | POA: Diagnosis not present

## 2019-01-20 DIAGNOSIS — I10 Essential (primary) hypertension: Secondary | ICD-10-CM | POA: Diagnosis not present

## 2019-01-20 DIAGNOSIS — E7800 Pure hypercholesterolemia, unspecified: Secondary | ICD-10-CM | POA: Diagnosis not present

## 2019-01-20 DIAGNOSIS — R7303 Prediabetes: Secondary | ICD-10-CM | POA: Diagnosis not present

## 2019-02-19 DIAGNOSIS — Z79899 Other long term (current) drug therapy: Secondary | ICD-10-CM | POA: Diagnosis not present

## 2019-02-19 DIAGNOSIS — E7800 Pure hypercholesterolemia, unspecified: Secondary | ICD-10-CM | POA: Diagnosis not present

## 2019-05-14 DIAGNOSIS — Z23 Encounter for immunization: Secondary | ICD-10-CM | POA: Diagnosis not present

## 2019-06-26 ENCOUNTER — Other Ambulatory Visit: Payer: Self-pay

## 2019-11-27 DIAGNOSIS — L409 Psoriasis, unspecified: Secondary | ICD-10-CM | POA: Diagnosis not present

## 2019-11-27 DIAGNOSIS — Z79899 Other long term (current) drug therapy: Secondary | ICD-10-CM | POA: Diagnosis not present

## 2019-11-27 DIAGNOSIS — Z5181 Encounter for therapeutic drug level monitoring: Secondary | ICD-10-CM | POA: Diagnosis not present

## 2020-03-29 DIAGNOSIS — Z5181 Encounter for therapeutic drug level monitoring: Secondary | ICD-10-CM | POA: Diagnosis not present

## 2020-03-29 DIAGNOSIS — L409 Psoriasis, unspecified: Secondary | ICD-10-CM | POA: Diagnosis not present

## 2020-04-04 DIAGNOSIS — H2513 Age-related nuclear cataract, bilateral: Secondary | ICD-10-CM | POA: Diagnosis not present

## 2020-05-27 DIAGNOSIS — Z23 Encounter for immunization: Secondary | ICD-10-CM | POA: Diagnosis not present

## 2020-05-30 DIAGNOSIS — Z23 Encounter for immunization: Secondary | ICD-10-CM | POA: Diagnosis not present

## 2020-07-27 DIAGNOSIS — Z139 Encounter for screening, unspecified: Secondary | ICD-10-CM | POA: Diagnosis not present

## 2020-07-27 DIAGNOSIS — I10 Essential (primary) hypertension: Secondary | ICD-10-CM | POA: Diagnosis not present

## 2020-07-27 DIAGNOSIS — Z79899 Other long term (current) drug therapy: Secondary | ICD-10-CM | POA: Diagnosis not present

## 2020-07-27 DIAGNOSIS — L409 Psoriasis, unspecified: Secondary | ICD-10-CM | POA: Diagnosis not present

## 2020-07-27 DIAGNOSIS — R7303 Prediabetes: Secondary | ICD-10-CM | POA: Diagnosis not present

## 2020-07-27 DIAGNOSIS — E7800 Pure hypercholesterolemia, unspecified: Secondary | ICD-10-CM | POA: Diagnosis not present

## 2020-07-27 DIAGNOSIS — Z6831 Body mass index (BMI) 31.0-31.9, adult: Secondary | ICD-10-CM | POA: Diagnosis not present

## 2020-08-09 DIAGNOSIS — Z5181 Encounter for therapeutic drug level monitoring: Secondary | ICD-10-CM | POA: Diagnosis not present

## 2020-08-09 DIAGNOSIS — L409 Psoriasis, unspecified: Secondary | ICD-10-CM | POA: Diagnosis not present

## 2020-08-09 DIAGNOSIS — Z79899 Other long term (current) drug therapy: Secondary | ICD-10-CM | POA: Diagnosis not present

## 2020-11-09 DIAGNOSIS — J069 Acute upper respiratory infection, unspecified: Secondary | ICD-10-CM | POA: Diagnosis not present

## 2020-11-28 DIAGNOSIS — Z23 Encounter for immunization: Secondary | ICD-10-CM | POA: Diagnosis not present

## 2021-02-07 DIAGNOSIS — L409 Psoriasis, unspecified: Secondary | ICD-10-CM | POA: Diagnosis not present

## 2021-02-07 DIAGNOSIS — Z79899 Other long term (current) drug therapy: Secondary | ICD-10-CM | POA: Diagnosis not present

## 2021-02-07 DIAGNOSIS — Z5181 Encounter for therapeutic drug level monitoring: Secondary | ICD-10-CM | POA: Diagnosis not present

## 2021-02-09 DIAGNOSIS — Z1331 Encounter for screening for depression: Secondary | ICD-10-CM | POA: Diagnosis not present

## 2021-02-09 DIAGNOSIS — Z0000 Encounter for general adult medical examination without abnormal findings: Secondary | ICD-10-CM | POA: Diagnosis not present

## 2021-02-09 DIAGNOSIS — Z9181 History of falling: Secondary | ICD-10-CM | POA: Diagnosis not present

## 2021-02-09 DIAGNOSIS — E785 Hyperlipidemia, unspecified: Secondary | ICD-10-CM | POA: Diagnosis not present

## 2021-02-09 DIAGNOSIS — E669 Obesity, unspecified: Secondary | ICD-10-CM | POA: Diagnosis not present

## 2021-02-17 DIAGNOSIS — I10 Essential (primary) hypertension: Secondary | ICD-10-CM | POA: Diagnosis not present

## 2021-02-17 DIAGNOSIS — L409 Psoriasis, unspecified: Secondary | ICD-10-CM | POA: Diagnosis not present

## 2021-02-17 DIAGNOSIS — R7303 Prediabetes: Secondary | ICD-10-CM | POA: Diagnosis not present

## 2021-02-17 DIAGNOSIS — E7800 Pure hypercholesterolemia, unspecified: Secondary | ICD-10-CM | POA: Diagnosis not present

## 2021-02-17 DIAGNOSIS — Z6832 Body mass index (BMI) 32.0-32.9, adult: Secondary | ICD-10-CM | POA: Diagnosis not present

## 2021-02-17 DIAGNOSIS — Z79899 Other long term (current) drug therapy: Secondary | ICD-10-CM | POA: Diagnosis not present

## 2021-04-26 DIAGNOSIS — Z23 Encounter for immunization: Secondary | ICD-10-CM | POA: Diagnosis not present

## 2021-08-15 DIAGNOSIS — L409 Psoriasis, unspecified: Secondary | ICD-10-CM | POA: Diagnosis not present

## 2021-08-15 DIAGNOSIS — L219 Seborrheic dermatitis, unspecified: Secondary | ICD-10-CM | POA: Diagnosis not present

## 2021-08-15 DIAGNOSIS — Z5181 Encounter for therapeutic drug level monitoring: Secondary | ICD-10-CM | POA: Diagnosis not present

## 2021-08-29 DIAGNOSIS — R7303 Prediabetes: Secondary | ICD-10-CM | POA: Diagnosis not present

## 2021-08-29 DIAGNOSIS — Z79899 Other long term (current) drug therapy: Secondary | ICD-10-CM | POA: Diagnosis not present

## 2021-08-29 DIAGNOSIS — I10 Essential (primary) hypertension: Secondary | ICD-10-CM | POA: Diagnosis not present

## 2021-08-29 DIAGNOSIS — Z6831 Body mass index (BMI) 31.0-31.9, adult: Secondary | ICD-10-CM | POA: Diagnosis not present

## 2021-08-29 DIAGNOSIS — L409 Psoriasis, unspecified: Secondary | ICD-10-CM | POA: Diagnosis not present

## 2021-08-29 DIAGNOSIS — E7800 Pure hypercholesterolemia, unspecified: Secondary | ICD-10-CM | POA: Diagnosis not present

## 2021-08-29 DIAGNOSIS — Z139 Encounter for screening, unspecified: Secondary | ICD-10-CM | POA: Diagnosis not present

## 2022-02-12 DIAGNOSIS — Z6831 Body mass index (BMI) 31.0-31.9, adult: Secondary | ICD-10-CM | POA: Diagnosis not present

## 2022-02-12 DIAGNOSIS — E669 Obesity, unspecified: Secondary | ICD-10-CM | POA: Diagnosis not present

## 2022-02-12 DIAGNOSIS — E785 Hyperlipidemia, unspecified: Secondary | ICD-10-CM | POA: Diagnosis not present

## 2022-02-12 DIAGNOSIS — Z1331 Encounter for screening for depression: Secondary | ICD-10-CM | POA: Diagnosis not present

## 2022-02-12 DIAGNOSIS — Z9181 History of falling: Secondary | ICD-10-CM | POA: Diagnosis not present

## 2022-02-12 DIAGNOSIS — Z0000 Encounter for general adult medical examination without abnormal findings: Secondary | ICD-10-CM | POA: Diagnosis not present

## 2022-03-01 DIAGNOSIS — R7303 Prediabetes: Secondary | ICD-10-CM | POA: Diagnosis not present

## 2022-03-01 DIAGNOSIS — I10 Essential (primary) hypertension: Secondary | ICD-10-CM | POA: Diagnosis not present

## 2022-03-01 DIAGNOSIS — Z79899 Other long term (current) drug therapy: Secondary | ICD-10-CM | POA: Diagnosis not present

## 2022-03-01 DIAGNOSIS — L409 Psoriasis, unspecified: Secondary | ICD-10-CM | POA: Diagnosis not present

## 2022-03-01 DIAGNOSIS — E7800 Pure hypercholesterolemia, unspecified: Secondary | ICD-10-CM | POA: Diagnosis not present

## 2022-03-05 DIAGNOSIS — Z79631 Long term (current) use of antimetabolite agent: Secondary | ICD-10-CM | POA: Diagnosis not present

## 2022-03-05 DIAGNOSIS — L409 Psoriasis, unspecified: Secondary | ICD-10-CM | POA: Diagnosis not present

## 2022-03-05 DIAGNOSIS — Z5181 Encounter for therapeutic drug level monitoring: Secondary | ICD-10-CM | POA: Diagnosis not present

## 2022-04-23 DIAGNOSIS — Z23 Encounter for immunization: Secondary | ICD-10-CM | POA: Diagnosis not present

## 2022-09-05 DIAGNOSIS — L409 Psoriasis, unspecified: Secondary | ICD-10-CM | POA: Diagnosis not present

## 2022-09-05 DIAGNOSIS — Z5181 Encounter for therapeutic drug level monitoring: Secondary | ICD-10-CM | POA: Diagnosis not present

## 2022-09-06 DIAGNOSIS — R7303 Prediabetes: Secondary | ICD-10-CM | POA: Diagnosis not present

## 2022-09-06 DIAGNOSIS — Z139 Encounter for screening, unspecified: Secondary | ICD-10-CM | POA: Diagnosis not present

## 2022-09-06 DIAGNOSIS — L409 Psoriasis, unspecified: Secondary | ICD-10-CM | POA: Diagnosis not present

## 2022-09-06 DIAGNOSIS — Z79899 Other long term (current) drug therapy: Secondary | ICD-10-CM | POA: Diagnosis not present

## 2022-09-06 DIAGNOSIS — I10 Essential (primary) hypertension: Secondary | ICD-10-CM | POA: Diagnosis not present

## 2022-09-06 DIAGNOSIS — E210 Primary hyperparathyroidism: Secondary | ICD-10-CM | POA: Diagnosis not present

## 2022-09-06 DIAGNOSIS — E7800 Pure hypercholesterolemia, unspecified: Secondary | ICD-10-CM | POA: Diagnosis not present

## 2022-09-25 DIAGNOSIS — N950 Postmenopausal bleeding: Secondary | ICD-10-CM | POA: Diagnosis not present

## 2022-09-26 ENCOUNTER — Other Ambulatory Visit: Payer: Self-pay

## 2022-09-26 DIAGNOSIS — N950 Postmenopausal bleeding: Secondary | ICD-10-CM

## 2022-10-02 ENCOUNTER — Encounter (HOSPITAL_BASED_OUTPATIENT_CLINIC_OR_DEPARTMENT_OTHER): Payer: Self-pay | Admitting: Obstetrics & Gynecology

## 2022-10-02 ENCOUNTER — Ambulatory Visit (INDEPENDENT_AMBULATORY_CARE_PROVIDER_SITE_OTHER): Payer: Medicare Other | Admitting: Obstetrics & Gynecology

## 2022-10-02 ENCOUNTER — Other Ambulatory Visit (HOSPITAL_COMMUNITY)
Admission: RE | Admit: 2022-10-02 | Discharge: 2022-10-02 | Disposition: A | Discharge status: Home / Self Care | Payer: Medicare Other | Source: Ambulatory Visit | Attending: Obstetrics & Gynecology | Admitting: Obstetrics & Gynecology

## 2022-10-02 ENCOUNTER — Ambulatory Visit (HOSPITAL_BASED_OUTPATIENT_CLINIC_OR_DEPARTMENT_OTHER)
Admission: RE | Admit: 2022-10-02 | Discharge: 2022-10-02 | Disposition: A | Discharge status: Home / Self Care | Payer: Medicare Other | Source: Ambulatory Visit | Attending: Obstetrics & Gynecology | Admitting: Obstetrics & Gynecology

## 2022-10-02 VITALS — BP 136/62 | HR 65 | Ht 63.0 in | Wt 173.8 lb

## 2022-10-02 DIAGNOSIS — N950 Postmenopausal bleeding: Secondary | ICD-10-CM | POA: Insufficient documentation

## 2022-10-02 DIAGNOSIS — D252 Subserosal leiomyoma of uterus: Secondary | ICD-10-CM | POA: Diagnosis not present

## 2022-10-02 DIAGNOSIS — Z124 Encounter for screening for malignant neoplasm of cervix: Secondary | ICD-10-CM

## 2022-10-02 DIAGNOSIS — N888 Other specified noninflammatory disorders of cervix uteri: Secondary | ICD-10-CM | POA: Insufficient documentation

## 2022-10-02 DIAGNOSIS — D259 Leiomyoma of uterus, unspecified: Secondary | ICD-10-CM | POA: Diagnosis not present

## 2022-10-02 MED ORDER — ASPIRIN 81 MG PO TBEC: 325.0000 mg | DELAYED_RELEASE_TABLET | Freq: Every day | ORAL | Status: DC

## 2022-10-02 NOTE — Progress Notes (Signed)
GYNECOLOGY  VISIT  CC:   new patient/PMP bleeding  HPI: 87 y.o. Widowed Black or [place] American female (who is accompanied by her sister) here for new patient appointment due to PMP bleeding.  Reports this started about two weeks ago and was initially bright red.  Has not had any cramping with the bleeding.  Hasn't had any in about two days.    She is on a baby aspirin and no blood thinners.     Past Medical History:  Diagnosis Date   Arthritis    "back";knee   Early cataracts, bilateral    Hyperlipidemia    takes Pravastatin nightly   Hypertension    takes Verapamil nightly   Joint pain    Psoriasis    methotrexate on Mondays-last dose 08/25/2012    MEDS:   Current Outpatient Medications on File Prior to Visit  Medication Sig Dispense Refill   bisacodyl (DULCOLAX) 10 MG suppository Place 1 suppository (10 mg total) rectally daily as needed. 12 suppository    Calcium Carbonate-Vitamin D (CALCIUM 600 + D PO) Take 1 tablet by mouth daily.     docusate sodium 100 MG CAPS Take 100 mg by mouth 2 (two) times daily. 10 capsule    folic acid (FOLVITE) 1 MG tablet Take 1 mg by mouth See admin instructions. Every day except Monday     methocarbamol (ROBAXIN) 500 MG tablet Take 1 tablet (500 mg total) by mouth every 6 (six) hours as needed. 30 tablet 0   methotrexate (RHEUMATREX) 2.5 MG tablet Take 5 mg by mouth once a week. Mondays. Caution:Chemotherapy. Protect from light.     pravastatin (PRAVACHOL) 40 MG tablet Take 40 mg by mouth at bedtime.     senna-docusate (SENOKOT-S) 8.6-50 MG per tablet Take 1 tablet by mouth at bedtime as needed.     verapamil (VERELAN PM) 240 MG 24 hr capsule Take 240 mg by mouth at bedtime.     No current facility-administered medications on file prior to visit.    ALLERGIES: Sulfa antibiotics  SH:  widowed, non smoker  Review of Systems  Constitutional: Negative.   Genitourinary:        Vaginal bleeding    PHYSICAL EXAMINATION:    BP 136/62  (BP Location: Left Arm, Patient Position: Sitting, Cuff Size: Large)   Pulse 65   Ht '5\' 3"'$  (1.6 m) Comment: Reported  Wt 173 lb 12.8 oz (78.8 kg)   BMI 30.79 kg/m     General appearance: alert, cooperative and appears stated age Lymph:  no inguinal LAD noted  Pelvic: External genitalia:  no lesions              Urethra:  normal appearing urethra with no masses, tenderness or lesions              Bartholins and Skenes: normal                 Vagina: normal appearing vagina with normal color and discharge, no lesions              Cervix: no lesions              Bimanual Exam:  Uterus:  enlarged, about 8 weeks size, globular and firm feeling, mobile              Adnexa: no mass, fullness, tenderness              Rectovaginal: Yes.  .  Confirms.  Anus:  normal sphincter tone, no lesions  Endometrial biopsy recommended.  Discussed with patient.  Verbal and written consent obtained.   Procedure:  Speculum placed.  Cervix visualized and cleansed with betadine prep.  A single toothed tenaculum was applied to the anterior lip of the cervix.  Endometrial pipelle was advanced through the cervix into the endometrial cavity without difficulty.  Pipelle passed to 6.5cm.  Suction applied.  Pipelle removed.  Significant amount of dark, old appearing blood present.  Three pipelles were filled before procedure ended due to pt discomfort.  Tenculum removed.  No bleeding noted.  Patient tolerated procedure well.  Chaperone, Ezekiel Ina, RN, was present for exam.  Assessment/Plan: 1. Postmenopausal bleeding - discussed with pt and her sister needed evaluation and then possible follow up, depending on results - Cytology - PAP( [place]) - Surgical pathology( [place]) - US PELVIC COMPLETE WITH TRANSVAGINAL; Future  2. Cervical cancer screening - Cytology - PAP( [place])

## 2022-10-03 LAB — SURGICAL PATHOLOGY

## 2022-10-08 LAB — CYTOLOGY - PAP: Diagnosis: NEGATIVE

## 2022-10-10 ENCOUNTER — Telehealth (HOSPITAL_BASED_OUTPATIENT_CLINIC_OR_DEPARTMENT_OTHER): Payer: Self-pay | Admitting: *Deleted

## 2022-10-10 NOTE — Telephone Encounter (Signed)
DOB verified. Informed pt that PAP was normal and biopsy did not show any abnormal cells. Advised that I would reach back out to her once I have received recommendations on next steps. Pt reports that she is continuing to have spotting, not a lot, but enough to need to wear a pad

## 2022-10-11 ENCOUNTER — Telehealth (HOSPITAL_BASED_OUTPATIENT_CLINIC_OR_DEPARTMENT_OTHER): Payer: Self-pay | Admitting: *Deleted

## 2022-10-11 NOTE — Telephone Encounter (Signed)
Called and spoke with pts sister Sonny Masters regarding recommendations from Dr. Sabra Heck. Advised that provider would like to repeat the biopsy using ultrasound guidance. Pt provided with appt for 3/13 @ 230.

## 2022-10-17 ENCOUNTER — Encounter (HOSPITAL_BASED_OUTPATIENT_CLINIC_OR_DEPARTMENT_OTHER): Payer: Self-pay | Admitting: Obstetrics & Gynecology

## 2022-10-17 ENCOUNTER — Other Ambulatory Visit (HOSPITAL_BASED_OUTPATIENT_CLINIC_OR_DEPARTMENT_OTHER): Payer: Self-pay | Admitting: *Deleted

## 2022-10-17 ENCOUNTER — Ambulatory Visit (INDEPENDENT_AMBULATORY_CARE_PROVIDER_SITE_OTHER): Payer: Medicare Other | Admitting: Obstetrics & Gynecology

## 2022-10-17 ENCOUNTER — Ambulatory Visit (INDEPENDENT_AMBULATORY_CARE_PROVIDER_SITE_OTHER): Payer: Medicare Other

## 2022-10-17 VITALS — BP 145/65 | HR 57 | Wt 173.0 lb

## 2022-10-17 DIAGNOSIS — N9489 Other specified conditions associated with female genital organs and menstrual cycle: Secondary | ICD-10-CM | POA: Diagnosis not present

## 2022-10-17 DIAGNOSIS — N950 Postmenopausal bleeding: Secondary | ICD-10-CM

## 2022-10-20 NOTE — Progress Notes (Signed)
GYNECOLOGY  VISIT  CC:   repeat ultrasound  HPI: 87 y.o. Widowed Cristina Barton here for repeat office and limited ultrasound due to my discomfort with the reading for the ultrasound done in radiology and also due to non-diagnostic endometrial specimen on biopsy.  Ultrasound today is showing a 6 x 4cm endometrial lesion.  It is not vascular.  There is some fluid in the cavity.  This is most consistent with a polyp.  However, this is very large.  Tissue sampling or removal is going to be needed and I feel this is likely going to need to be done in the OR.  Procedure including risks reviewed with her sister due to memory changes in pt.  Will review with gyn/oncology prior to scheduling. Pt's sister is comfortable with plan.   Past Medical History:  Diagnosis Date   Arthritis    "back";knee   Early cataracts, bilateral    Hyperlipidemia    takes Pravastatin nightly   Hypertension    takes Verapamil nightly   Joint pain    Psoriasis    methotrexate on Mondays-last dose 08/25/2012    MEDS:   Current Outpatient Medications on File Prior to Visit  Medication Sig Dispense Refill   aspirin EC 81 MG tablet Take 4 tablets (325 mg total) by mouth daily with breakfast.     bisacodyl (DULCOLAX) 10 MG suppository Place 1 suppository (10 mg total) rectally daily as needed. 12 suppository    Calcium Carbonate-Vitamin D (CALCIUM 600 + D PO) Take 1 tablet by mouth daily.     docusate sodium 100 MG CAPS Take 100 mg by mouth 2 (two) times daily. 10 capsule    folic acid (FOLVITE) 1 MG tablet Take 1 mg by mouth See admin instructions. Every day except Monday     methocarbamol (ROBAXIN) 500 MG tablet Take 1 tablet (500 mg total) by mouth every 6 (six) hours as needed. 30 tablet 0   methotrexate (RHEUMATREX) 2.5 MG tablet Take 5 mg by mouth once a week. Mondays. Caution:Chemotherapy. Protect from light.     pravastatin (PRAVACHOL) 40 MG tablet Take 40 mg by mouth at bedtime.      senna-docusate (SENOKOT-S) 8.6-50 MG per tablet Take 1 tablet by mouth at bedtime as needed.     verapamil (VERELAN PM) 240 MG 24 hr capsule Take 240 mg by mouth at bedtime.     No current facility-administered medications on file prior to visit.    ALLERGIES: Sulfa antibiotics  SH:  widowed, non smoker  Review of Systems  Constitutional: Negative.   Genitourinary:        +PMP Bleeding    PHYSICAL EXAMINATION:    BP (!) 145/65 (BP Location: Left Arm, Patient Position: Sitting, Cuff Size: Large)   Pulse (!) 57   Wt 173 lb (78.5 kg)   BMI 30.65 kg/m     General appearance: alert, cooperative and appears stated age   Assessment/Plan: 1. Postmenopausal bleeding - am going to discuss with Dr. Berline Lopes, gyn/onc, prior to scheduling.  Will need to try and removal lesion or proceed with tissue sampling for diagnosis.  2. Endometrial mass

## 2022-10-27 DIAGNOSIS — F03A0 Unspecified dementia, mild, without behavioral disturbance, psychotic disturbance, mood disturbance, and anxiety: Secondary | ICD-10-CM | POA: Diagnosis not present

## 2022-10-27 DIAGNOSIS — I10 Essential (primary) hypertension: Secondary | ICD-10-CM | POA: Diagnosis not present

## 2022-10-27 DIAGNOSIS — N939 Abnormal uterine and vaginal bleeding, unspecified: Secondary | ICD-10-CM | POA: Diagnosis not present

## 2022-10-27 DIAGNOSIS — D4959 Neoplasm of unspecified behavior of other genitourinary organ: Secondary | ICD-10-CM | POA: Diagnosis not present

## 2022-10-27 DIAGNOSIS — Z882 Allergy status to sulfonamides status: Secondary | ICD-10-CM | POA: Diagnosis not present

## 2022-10-29 ENCOUNTER — Telehealth (HOSPITAL_BASED_OUTPATIENT_CLINIC_OR_DEPARTMENT_OTHER): Payer: Self-pay | Admitting: Obstetrics & Gynecology

## 2022-10-29 NOTE — Telephone Encounter (Signed)
LMOM for pts sister to call back regarding surgery.

## 2022-10-29 NOTE — Telephone Encounter (Signed)
Patient sister came by today and stated patient was seen in [place] on Saturday.Sister would like to know  when will they call her  to have surgery scheduled.

## 2022-10-30 NOTE — Telephone Encounter (Signed)
LMOM for pts sister, [name] to call office.

## 2022-11-05 ENCOUNTER — Other Ambulatory Visit (HOSPITAL_COMMUNITY): Payer: Self-pay | Admitting: Obstetrics & Gynecology

## 2022-11-05 ENCOUNTER — Encounter (HOSPITAL_BASED_OUTPATIENT_CLINIC_OR_DEPARTMENT_OTHER): Payer: Self-pay | Admitting: Obstetrics & Gynecology

## 2022-11-05 DIAGNOSIS — N950 Postmenopausal bleeding: Secondary | ICD-10-CM

## 2022-11-06 ENCOUNTER — Encounter (HOSPITAL_BASED_OUTPATIENT_CLINIC_OR_DEPARTMENT_OTHER): Payer: Self-pay | Admitting: Obstetrics & Gynecology

## 2022-11-06 NOTE — Progress Notes (Addendum)
Spoke w/ via phone for pre-op interview--- pt  and pt's sister , Cristina Barton Lab needs dos---- Cristina Barton , ekg              Lab results------ no COVID test -----patient states asymptomatic no test needed Arrive at ------- 0645 on 11-08-2022 NPO after MN with exception sips of water with meds Med rec completed Medications to take morning of surgery ----- none Diabetic medication ----- n/a Patient instructed no nail polish to be worn day of surgery Patient instructed to bring photo id and insurance card day of surgery Patient aware to have Driver (ride ) / caregiver    for 24 hours after surgery -- sister, Cristina Barton Patient Special Instructions ----- n/a Pre-Op special Istructions ----- sent inbox message in epic to dr Sabra Heck, requested orders Pt needs sister Cristina Barton in pre-op due to memory loss.  [name] will bring pt's complete medication list to update in epic dos. Patient verbalized understanding of instructions that were given at this phone interview. Patient denies shortness of breath, chest pain, fever, cough at this phone interview.

## 2022-11-07 ENCOUNTER — Other Ambulatory Visit (HOSPITAL_BASED_OUTPATIENT_CLINIC_OR_DEPARTMENT_OTHER): Payer: Self-pay | Admitting: Obstetrics & Gynecology

## 2022-11-07 DIAGNOSIS — Z01818 Encounter for other preprocedural examination: Secondary | ICD-10-CM

## 2022-11-08 ENCOUNTER — Other Ambulatory Visit: Payer: Self-pay

## 2022-11-08 ENCOUNTER — Ambulatory Visit (HOSPITAL_BASED_OUTPATIENT_CLINIC_OR_DEPARTMENT_OTHER): Payer: Medicare Other | Admitting: Anesthesiology

## 2022-11-08 ENCOUNTER — Ambulatory Visit (HOSPITAL_BASED_OUTPATIENT_CLINIC_OR_DEPARTMENT_OTHER)
Admission: RE | Admit: 2022-11-08 | Discharge: 2022-11-08 | Disposition: A | Discharge status: Home / Self Care | Payer: Medicare Other | Attending: Obstetrics & Gynecology | Admitting: Obstetrics & Gynecology

## 2022-11-08 ENCOUNTER — Encounter (HOSPITAL_BASED_OUTPATIENT_CLINIC_OR_DEPARTMENT_OTHER)
Admission: RE | Disposition: A | Discharge status: Home / Self Care | Payer: Self-pay | Source: Home / Self Care | Attending: Obstetrics & Gynecology

## 2022-11-08 ENCOUNTER — Ambulatory Visit (HOSPITAL_COMMUNITY)
Admission: RE | Admit: 2022-11-08 | Discharge: 2022-11-08 | Disposition: A | Discharge status: Home / Self Care | Payer: Medicare Other | Source: Ambulatory Visit | Attending: Obstetrics & Gynecology | Admitting: Obstetrics & Gynecology

## 2022-11-08 ENCOUNTER — Encounter (HOSPITAL_BASED_OUTPATIENT_CLINIC_OR_DEPARTMENT_OTHER): Payer: Self-pay | Admitting: Obstetrics & Gynecology

## 2022-11-08 DIAGNOSIS — R9389 Abnormal findings on diagnostic imaging of other specified body structures: Secondary | ICD-10-CM | POA: Diagnosis not present

## 2022-11-08 DIAGNOSIS — N950 Postmenopausal bleeding: Secondary | ICD-10-CM | POA: Insufficient documentation

## 2022-11-08 DIAGNOSIS — I10 Essential (primary) hypertension: Secondary | ICD-10-CM | POA: Diagnosis not present

## 2022-11-08 DIAGNOSIS — N840 Polyp of corpus uteri: Secondary | ICD-10-CM | POA: Insufficient documentation

## 2022-11-08 DIAGNOSIS — D259 Leiomyoma of uterus, unspecified: Secondary | ICD-10-CM

## 2022-11-08 DIAGNOSIS — C541 Malignant neoplasm of endometrium: Secondary | ICD-10-CM | POA: Diagnosis not present

## 2022-11-08 DIAGNOSIS — M1990 Unspecified osteoarthritis, unspecified site: Secondary | ICD-10-CM | POA: Diagnosis not present

## 2022-11-08 DIAGNOSIS — D630 Anemia in neoplastic disease: Secondary | ICD-10-CM | POA: Diagnosis not present

## 2022-11-08 DIAGNOSIS — Z01818 Encounter for other preprocedural examination: Secondary | ICD-10-CM

## 2022-11-08 HISTORY — DX: Other amnesia: R41.3

## 2022-11-08 HISTORY — DX: Postmenopausal bleeding: N95.0

## 2022-11-08 HISTORY — PX: HYSTEROSCOPY WITH D & C: SHX1775

## 2022-11-08 HISTORY — DX: Presence of spectacles and contact lenses: Z97.3

## 2022-11-08 HISTORY — PX: OPERATIVE ULTRASOUND: SHX5996

## 2022-11-08 HISTORY — DX: Anemia, unspecified: D64.9

## 2022-11-08 LAB — POCT I-STAT, CHEM 8
BUN: 9 mg/dL (ref 8–23)
Calcium, Ion: 1.38 mmol/L (ref 1.15–1.40)
Chloride: 104 mmol/L (ref 98–111)
Creatinine, Ser: 0.8 mg/dL (ref 0.44–1.00)
Glucose, Bld: 108 mg/dL — ABNORMAL HIGH (ref 70–99)
HCT: 41 % (ref 36.0–46.0)
Hemoglobin: 13.9 g/dL (ref 12.0–15.0)
Potassium: 4.2 mmol/L (ref 3.5–5.1)
Sodium: 141 mmol/L (ref 135–145)
TCO2: 27 mmol/L (ref 22–32)

## 2022-11-08 LAB — TYPE AND SCREEN
ABO/RH(D): B POS
Antibody Screen: NEGATIVE

## 2022-11-08 LAB — CBC
HCT: 42.3 % (ref 36.0–46.0)
Hemoglobin: 13.5 g/dL (ref 12.0–15.0)
MCH: 29.2 pg (ref 26.0–34.0)
MCHC: 31.9 g/dL (ref 30.0–36.0)
MCV: 91.4 fL (ref 80.0–100.0)
Platelets: 227 10*3/uL (ref 150–400)
RBC: 4.63 MIL/uL (ref 3.87–5.11)
RDW: 13.4 % (ref 11.5–15.5)
WBC: 7 10*3/uL (ref 4.0–10.5)
nRBC: 0 % (ref 0.0–0.2)

## 2022-11-08 SURGERY — DILATATION AND CURETTAGE /HYSTEROSCOPY
Anesthesia: General | Site: Uterus

## 2022-11-08 MED ORDER — PROPOFOL 10 MG/ML IV BOLUS
INTRAVENOUS | Status: DC | PRN
  Administered 2022-11-08: 20 mg via INTRAVENOUS
  Administered 2022-11-08: 90 mg via INTRAVENOUS

## 2022-11-08 MED ORDER — ACETAMINOPHEN 160 MG/5ML PO SOLN: 325.0000 mg | ORAL | Status: DC | PRN

## 2022-11-08 MED ORDER — POVIDONE-IODINE 10 % EX SWAB: 2.0000 | Freq: Once | CUTANEOUS | Status: DC

## 2022-11-08 MED ORDER — TRANEXAMIC ACID-NACL 1000-0.7 MG/100ML-% IV SOLN
INTRAVENOUS | Status: AC
  Filled 2022-11-08: qty 100

## 2022-11-08 MED ORDER — KETOROLAC TROMETHAMINE 30 MG/ML IJ SOLN
INTRAMUSCULAR | Status: AC
  Filled 2022-11-08: qty 1

## 2022-11-08 MED ORDER — SILVER NITRATE-POT NITRATE 75-25 % EX MISC
CUTANEOUS | Status: DC | PRN
  Administered 2022-11-08: 2

## 2022-11-08 MED ORDER — LACTATED RINGERS IV SOLN
INTRAVENOUS | Status: DC
  Administered 2022-11-08: 1000 mL via INTRAVENOUS

## 2022-11-08 MED ORDER — FENTANYL CITRATE (PF) 100 MCG/2ML IJ SOLN
INTRAMUSCULAR | Status: AC
  Filled 2022-11-08: qty 2

## 2022-11-08 MED ORDER — ACETAMINOPHEN 325 MG PO TABS: 325.0000 mg | ORAL_TABLET | ORAL | Status: DC | PRN

## 2022-11-08 MED ORDER — KETOROLAC TROMETHAMINE 30 MG/ML IJ SOLN
INTRAMUSCULAR | Status: DC | PRN
  Administered 2022-11-08: 15 mg via INTRAVENOUS

## 2022-11-08 MED ORDER — FENTANYL CITRATE (PF) 100 MCG/2ML IJ SOLN
INTRAMUSCULAR | Status: DC | PRN
  Administered 2022-11-08: 25 ug via INTRAVENOUS

## 2022-11-08 MED ORDER — PROPOFOL 1000 MG/100ML IV EMUL
INTRAVENOUS | Status: AC
  Filled 2022-11-08: qty 100

## 2022-11-08 MED ORDER — VASOPRESSIN 20 UNIT/ML IV SOLN
INTRAVENOUS | Status: DC | PRN
  Administered 2022-11-08: .5 [IU]

## 2022-11-08 MED ORDER — PROPOFOL 500 MG/50ML IV EMUL
INTRAVENOUS | Status: DC | PRN
  Administered 2022-11-08: 100 ug/kg/min via INTRAVENOUS

## 2022-11-08 MED ORDER — ONDANSETRON HCL 4 MG/2ML IJ SOLN
INTRAMUSCULAR | Status: AC
  Filled 2022-11-08: qty 2

## 2022-11-08 MED ORDER — MEPERIDINE HCL 25 MG/ML IJ SOLN: 6.2500 mg | INTRAMUSCULAR | Status: DC | PRN

## 2022-11-08 MED ORDER — LIDOCAINE-EPINEPHRINE 1 %-1:100000 IJ SOLN
INTRAMUSCULAR | Status: DC | PRN
  Administered 2022-11-08: 10 mL

## 2022-11-08 MED ORDER — FENTANYL CITRATE (PF) 100 MCG/2ML IJ SOLN: 25.0000 ug | INTRAMUSCULAR | Status: DC | PRN

## 2022-11-08 MED ORDER — ONDANSETRON HCL 4 MG/2ML IJ SOLN
INTRAMUSCULAR | Status: DC | PRN
  Administered 2022-11-08: 4 mg via INTRAVENOUS

## 2022-11-08 MED ORDER — ONDANSETRON HCL 4 MG/2ML IJ SOLN: 4.0000 mg | Freq: Once | INTRAMUSCULAR | Status: DC | PRN

## 2022-11-08 MED ORDER — OXYCODONE HCL 5 MG/5ML PO SOLN: 5.0000 mg | Freq: Once | ORAL | Status: DC | PRN

## 2022-11-08 MED ORDER — SODIUM CHLORIDE 0.9 % IR SOLN
Status: DC | PRN
  Administered 2022-11-08 (×2): 3000 mL

## 2022-11-08 MED ORDER — HYDROCODONE-ACETAMINOPHEN 5-325 MG PO TABS: 1.0000 | ORAL_TABLET | Freq: Four times a day (QID) | ORAL | 0 refills | Status: DC | PRN

## 2022-11-08 MED ORDER — TRANEXAMIC ACID-NACL 1000-0.7 MG/100ML-% IV SOLN: 1000.0000 mg | Freq: Once | INTRAVENOUS | Status: DC

## 2022-11-08 MED ORDER — OXYCODONE HCL 5 MG PO TABS: 5.0000 mg | ORAL_TABLET | Freq: Once | ORAL | Status: DC | PRN

## 2022-11-08 MED ORDER — SODIUM CHLORIDE (PF) 0.9 % IJ SOLN
INTRAMUSCULAR | Status: DC | PRN
  Administered 2022-11-08: 50 mL

## 2022-11-08 MED ORDER — LIDOCAINE HCL (PF) 2 % IJ SOLN
INTRAMUSCULAR | Status: AC
  Filled 2022-11-08: qty 5

## 2022-11-08 SURGICAL SUPPLY — 24 items
BAG DECANTER FOR FLEXI CONT (MISCELLANEOUS) IMPLANT
CATH ROBINSON RED A/P 16FR (CATHETERS) ×3 IMPLANT
DEVICE MYOSURE LITE (MISCELLANEOUS) IMPLANT
DEVICE MYOSURE REACH (MISCELLANEOUS) IMPLANT
DILATOR CANAL MILEX (MISCELLANEOUS) IMPLANT
DRSG TELFA 3X8 NADH STRL (GAUZE/BANDAGES/DRESSINGS) ×3 IMPLANT
GAUZE 4X4 16PLY ~~LOC~~+RFID DBL (SPONGE) ×6 IMPLANT
GLOVE BIOGEL PI IND STRL 7.0 (GLOVE) ×3 IMPLANT
GLOVE ECLIPSE 6.5 STRL STRAW (GLOVE) ×6 IMPLANT
GOWN STRL REUS W/TWL LRG LVL3 (GOWN DISPOSABLE) ×6 IMPLANT
IV NS IRRIG 3000ML ARTHROMATIC (IV SOLUTION) ×3 IMPLANT
KIT PROCEDURE FLUENT (KITS) ×3 IMPLANT
KIT TURNOVER CYSTO (KITS) ×3 IMPLANT
LOOP CUTTING BIPOLAR 21FR (ELECTRODE) IMPLANT
NDL HYPO 22X1.5 SAFETY MO (MISCELLANEOUS) IMPLANT
NEEDLE HYPO 22X1.5 SAFETY MO (MISCELLANEOUS) ×2 IMPLANT
PACK VAGINAL MINOR WOMEN LF (CUSTOM PROCEDURE TRAY) ×3 IMPLANT
PAD OB MATERNITY 4.3X12.25 (PERSONAL CARE ITEMS) ×3 IMPLANT
SEAL CERVICAL OMNI LOK (ABLATOR) IMPLANT
SEAL ROD LENS SCOPE MYOSURE (ABLATOR) ×3 IMPLANT
SLEEVE SCD COMPRESS KNEE MED (STOCKING) ×3 IMPLANT
SYR TB 1ML LL NO SAFETY (SYRINGE) IMPLANT
TOWEL OR 17X24 6PK STRL BLUE (TOWEL DISPOSABLE) ×3 IMPLANT
WATER STERILE IRR 500ML POUR (IV SOLUTION) ×3 IMPLANT

## 2022-11-08 NOTE — Op Note (Signed)
11/08/2022  12:02 PM  PATIENT:  Cristina Barton  87 y.o. female  PRE-OPERATIVE DIAGNOSIS:  POST MENOPAUSAL BLEEDING, ABNORMAL ENDOMETRIUM, UTERINE FIBROIDS  POST-OPERATIVE DIAGNOSIS:  POST MENOPAUSAL BLEEDING, LARGE ENDOMETRIAL POLYP, UTERINE FIBROIDS  PROCEDURE:  Procedure(s): DILATATION AND CURETTAGE /HYSTEROSCOPY/MYOSURE OPERATIVE ULTRASOUND  SURGEON:  Megan Salon  ASSISTANTS: OR staff.    ANESTHESIA:   general  ESTIMATED BLOOD LOSS: 10 mL  BLOOD ADMINISTERED:none   FLUIDS: 500cc LR  UOP: pt voided before going back to the OR  SPECIMEN:  endometrial polyp and curettings  DISPOSITION OF SPECIMEN:  PATHOLOGY  FINDINGS: very large endometrial polyp filling the endometrial cavity.  Anterior fibroid distorting the endometrium.  Thin appearing endometrium  DESCRIPTION OF OPERATION: Patient was taken to the operating room.  She is placed in the supine position. SCDs were on her lower extremities and functioning properly. General anesthesia with an LMA was administered without difficulty. Dr. Eligha Bridegroom, anesthesia, oversaw case.  Legs were then placed in the [place] in the low lithotomy position. The legs were lifted to the high lithotomy position and the Betadine prep was used on the inner thighs perineum and vagina x3. Patient was draped in a normal standard fashion. An in and out catheterization with a red rubber.  A bivalve speculum was placed the vagina. The anterior lip of the cervix was grasped with single-tooth tenaculum.  A paracervical block of 1% lidocaine mixed one-to-one with epinephrine (1:100,000 units).  10 cc was used total. The cervix is dilated up to #21 [facility] dilators. The endometrial cavity sounded to 9 cm.   A 5.5 millimeter operative hysteroscope was obtained. Normal saline was used as a hysteroscopic fluid. The hysteroscope was advanced through the endocervical canal into the endometrial cavity. Anterior fibroid was noted.  Very large endometrial polyp was  seen.  Tubal ostium could not initially be seen but were eventually once some of the polyp was resected.  Hysteroscope was removed.  Pitressin mixed with NS 1:20 was used and 10cc total was instilled in the cervix.  Using the [facility] reach device, the polyp was resected.  This resection took at least 25 minutes due to the size of the polyp.  Once it was fully removed, the hysteroscope was removed. A #1 toothed curette was used to curette the cavity until rough gritty texture was noted in all quadrants. At this point no other procedure was needed and this procedure was ended. The hysteroscope was removed. The fluid deficit was 120 cc. The tenaculum was removed from the anterior lip of the cervix. The speculum was removed from the vagina. The prep was cleansed of the patient's skin. The legs are positioned back in the supine position. Sponge, lap, needle, instrument counts were correct x2. Patient was taken to recovery in stable condition.  COUNTS:  YES  PLAN OF CARE: Transfer to PACU

## 2022-11-08 NOTE — Anesthesia Postprocedure Evaluation (Signed)
Anesthesia Post Note  Patient: Cristina Barton  Procedure(s) Performed: DILATATION AND CURETTAGE /HYSTEROSCOPY/MYOSURE (Uterus) OPERATIVE ULTRASOUND (Abdomen)     Patient location during evaluation: PACU Anesthesia Type: General Level of consciousness: awake and alert Pain management: pain level controlled Vital Signs Assessment: post-procedure vital signs reviewed and stable Respiratory status: spontaneous breathing, nonlabored ventilation, respiratory function stable and patient connected to nasal cannula oxygen Cardiovascular status: blood pressure returned to baseline and stable Postop Assessment: no apparent nausea or vomiting Anesthetic complications: no  No notable events documented.  Last Vitals:  Vitals:   11/08/22 1215 11/08/22 1230  BP:  (!) 157/75  Pulse:  61  Resp:    Temp:    SpO2: 96%     Last Pain:  Vitals:   11/08/22 1230  TempSrc:   PainSc: 0-No pain                 [name]

## 2022-11-08 NOTE — H&P (Signed)
Cristina Barton is an 87 y.o. female here for hysteroscopy due to PMP bleeding and ultrasound showing a 4 x 6cm endometrial mass.  The lesion is not vascular on ultrasound.  Endometrial biopsy only had blood in it and no significant endometrial tissue.  Findings on ultrasound are most consistent with polyp but this would be a very large one.  Hysteroscopy with resection of lesion if possible or at least sampling of the lesion recommended.  Pt is here for this.  Procedure discussed with patient and her sister.  Recovery and pain management discussed.  Risks discussed including but not limited to bleeding, rare risk of transfusion, infection, 1% risk of uterine perforation with risks of fluid deficit causing cardiac arrythmia, cerebral swelling and/or need to stop procedure early.  Fluid emboli and rare risk of death discussed.  DVT/PE, rare risk of risk of bowel/bladder/ureteral/vascular injury.  Patient aware if pathology abnormal she may need additional treatment.  All questions answered.    Pertinent Gynecological History: Menses: post-menopausal Bleeding: post menopausal bleeding Contraception: post menopausal status DES exposure: denies Blood transfusions: none Sexually transmitted diseases: no past history Previous GYN Procedures:  D&C done 2003 by Dr. Gertie Fey   Last mammogram: normal Date: 02/20/2017.  Have stopped having mammograms done Last pap: normal Date: 10/02/2022  Menstrual History: No LMP recorded. Patient is postmenopausal.    Past Medical History:  Diagnosis Date   Anemia    Arthritis    knees, back   Hyperlipidemia    Hypertension    Memory loss    PMB (postmenopausal bleeding)    Psoriasis    dermatologist---   dr Lorain Childes;    methotrexate on Mondays   Wears glasses     Past Surgical History:  Procedure Laterality Date   COLONOSCOPY     DILATION AND CURETTAGE OF UTERUS  11/12/2001   @[facility]  by dr Gertie Fey   KNEE ARTHROPLASTY  09/08/2012   Procedure: COMPUTER ASSISTED TOTAL  KNEE ARTHROPLASTY;  Surgeon: Marybelle Killings, MD;  Location: [place];  Service: Orthopedics;  Laterality: Left;  Left total knee arthroplasty, cemented   KNEE ARTHROSCOPY Left 03/25/2001   @[facility]  by dr Mamie Nick. carter   LUMBAR LAMINECTOMY  03/12/2012   Procedure: MICRODISCECTOMY LUMBAR LAMINECTOMY;  Surgeon: Marybelle Killings, MD;  Location: [place];  Service: Orthopedics;  Laterality: N/A;  L3-4, L4-5 Decompression   TONSILLECTOMY     child    Family History  Problem Relation Age of Onset   Breast cancer Mother    Breast cancer Sister     Social History:  reports that she has never smoked. She has never used smokeless tobacco. She reports that she does not drink alcohol and does not use drugs.  Allergies:  Allergies  Allergen Reactions   Sulfa Antibiotics Rash    Medications Prior to Admission  Medication Sig Dispense Refill Last Dose   aspirin EC 81 MG tablet Take 81 mg by mouth at bedtime. Swallow whole.   11/05/2022   calcium carbonate (OSCAL) 1500 (600 Ca) MG TABS tablet Take 1,500 mg by mouth 2 (two) times daily with a meal. Unsure of dosage      folic acid (FOLVITE) 1 MG tablet Take 1 mg by mouth daily. Unsure of dosage   11/07/2022   methotrexate (RHEUMATREX) 2.5 MG tablet Take 5 mg by mouth once a week. Mondays. Caution:Chemotherapy. Protect from light. /   per pt takes on monday's   11/05/2022   metoprolol succinate (TOPROL-XL) 50 MG 24  hr tablet Take 50 mg by mouth daily. Take with or immediately following a meal.   11/07/2022   NON FORMULARY Take 1 tablet by mouth daily. Slow iron   11/07/2022   pravastatin (PRAVACHOL) 40 MG tablet Take 40 mg by mouth at bedtime.   11/07/2022   valsartan (DIOVAN) 80 MG tablet Take 80 mg by mouth daily.   11/07/2022   verapamil (VERELAN PM) 240 MG 24 hr capsule Take 240 mg by mouth at bedtime.       Review of Systems  Constitutional: Negative.   Genitourinary:        Vaginal bleeding but very light    Blood pressure (!) 157/73, pulse 68, temperature (!) 97.4 F  (36.3 C), temperature source Oral, resp. rate 17, height 5\' 3"  (1.6 m), weight 78.2 kg, SpO2 99 %. Physical Exam Constitutional:      Appearance: Normal appearance.  Cardiovascular:     Rate and Rhythm: Normal rate and regular rhythm.  Pulmonary:     Effort: Pulmonary effort is normal.     Breath sounds: Normal breath sounds.  Skin:    General: Skin is warm and dry.  Neurological:     Mental Status: She is alert. Mental status is at baseline.  Psychiatric:        Mood and Affect: Mood normal.     Results for orders placed or performed during the hospital encounter of 11/08/22 (from the past 24 hour(s))  CBC     Status: None   Collection Time: 11/08/22  7:24 AM  Result Value Ref Range   WBC 7.0 4.0 - 10.5 K/uL   RBC 4.63 3.87 - 5.11 MIL/uL   Hemoglobin 13.5 12.0 - 15.0 g/dL   HCT 42.3 36.0 - 46.0 %   MCV 91.4 80.0 - 100.0 fL   MCH 29.2 26.0 - 34.0 pg   MCHC 31.9 30.0 - 36.0 g/dL   RDW 13.4 11.5 - 15.5 %   Platelets 227 150 - 400 K/uL   nRBC 0.0 0.0 - 0.2 %  I-STAT, chem 8     Status: Abnormal   Collection Time: 11/08/22  7:35 AM  Result Value Ref Range   Sodium 141 135 - 145 mmol/L   Potassium 4.2 3.5 - 5.1 mmol/L   Chloride 104 98 - 111 mmol/L   BUN 9 8 - 23 mg/dL   Creatinine, Ser 0.80 0.44 - 1.00 mg/dL   Glucose, Bld 108 (H) 70 - 99 mg/dL   Calcium, Ion 1.38 1.15 - 1.40 mmol/L   TCO2 27 22 - 32 mmol/L   Hemoglobin 13.9 12.0 - 15.0 g/dL   HCT 41.0 36.0 - 46.0 %    No results found.  Assessment/Plan: 87 yo WWF with hx of PMP bleeding and large endometrial lesion that is not vascular here for additional evaluation with hysteroscopy and possible tissue sampling, excision.  Procedure reviewed with family and pt.  Question answered.  They are ready to proceed.  Megan Salon 11/08/2022, 8:18 AM

## 2022-11-08 NOTE — Anesthesia Preprocedure Evaluation (Addendum)
Anesthesia Evaluation  Patient identified by MRN, date of birth, ID band Patient awake    Reviewed: Allergy & Precautions, H&P , NPO status , Patient's Chart, lab work & pertinent test results  Airway Mallampati: II  TM Distance: >3 FB Neck ROM: Full    Dental no notable dental hx. (+) Teeth Intact, Dental Advisory Given   Pulmonary neg pulmonary ROS   Pulmonary exam normal breath sounds clear to auscultation       Cardiovascular Exercise Tolerance: Good hypertension, Pt. on medications and Pt. on home beta blockers Normal cardiovascular exam Rhythm:Regular Rate:Normal     Neuro/Psych negative neurological ROS  negative psych ROS   GI/Hepatic negative GI ROS, Neg liver ROS,,,  Endo/Other  negative endocrine ROS    Renal/GU negative Renal ROS  negative genitourinary   Musculoskeletal negative musculoskeletal ROS (+) Arthritis , Osteoarthritis,    Abdominal   Peds negative pediatric ROS (+)  Hematology negative hematology ROS (+) Blood dyscrasia, anemia   Anesthesia Other Findings   Reproductive/Obstetrics negative OB ROS                             Anesthesia Physical Anesthesia Plan  ASA: 3  Anesthesia Plan: General   Post-op Pain Management: Minimal or no pain anticipated   Induction: Intravenous  PONV Risk Score and Plan: 3 and TIVA  Airway Management Planned: LMA  Additional Equipment: None  Intra-op Plan:   Post-operative Plan: Extubation in OR  Informed Consent: I have reviewed the patients History and Physical, chart, labs and discussed the procedure including the risks, benefits and alternatives for the proposed anesthesia with the patient or authorized representative who has indicated his/her understanding and acceptance.       Plan Discussed with: CRNA and Anesthesiologist  Anesthesia Plan Comments: ( )        Anesthesia Quick Evaluation

## 2022-11-08 NOTE — Discharge Instructions (Addendum)
Post-surgical Instructions, Outpatient Surgery  You may expect to feel dizzy, weak, and drowsy for as long as 24 hours after receiving the medicine that made you sleep (anesthetic). For the first 24 hours after your surgery:   Do not drive a car, ride a bicycle, participate in physical activities, or take public transportation until you are done taking narcotic pain medicines or as directed by Dr. Sabra Heck.  Do not drink alcohol or take tranquilizers.  Do not take medicine that has not been prescribed by your physicians.  Do not sign important papers or make important decisions while on narcotic pain medicines.  Have a responsible person with you.   PAIN MANAGEMENT Motrin 600mg .  (This is the same as 3-200mg  over the counter tablets of Motrin or ibuprofen.)  You may take this every eight hours or as needed for cramping.  This can be alternated with two extra strength tylenol (1000mg ) every 6 hours as well.   Vicodin 5/325mg .  For more severe pain, take 1/2 to 1 tablet every six hours as needed for pain control.  (Remember that narcotic pain medications increase your risk of constipation.  If this becomes a problem, you may take an over the counter stool softener like Colace 100mg  up to four times a day.)  DO'S AND DON'T'S Do not take a tub bath for one week.  You may shower on the first day after your surgery Do not do any heavy lifting for one to two weeks.  This increases the chance of bleeding. Do move around as you feel able.  Stairs are fine.  You may begin to exercise again as you feel able.  Do not lift any weights for two weeks. Do not put anything in the vagina for two weeks--no tampons, intercourse, or douching.    REGULAR MEDIATIONS/VITAMINS: You may restart all of your regular medications as prescribed. You may restart all of your vitamins as you normally take them.    PLEASE CALL OR SEEK MEDICAL CARE IF: You have persistent nausea and vomiting.  You have trouble eating or  drinking.  You have an oral temperature above 100.5.  You have constipation that is not helped by adjusting diet or increasing fluid intake. Pain medicines are a common cause of constipation.  You have heavy vaginal bleeding

## 2022-11-08 NOTE — Anesthesia Procedure Notes (Signed)
Procedure Name: LMA Insertion Date/Time: 11/08/2022 11:03 AM  Performed by: Bonney Aid, CRNAPre-anesthesia Checklist: Patient identified, Emergency Drugs available, Suction available and Patient being monitored Patient Re-evaluated:Patient Re-evaluated prior to induction Oxygen Delivery Method: Circle system utilized Preoxygenation: Pre-oxygenation with 100% oxygen Induction Type: IV induction Ventilation: Mask ventilation without difficulty LMA: LMA inserted LMA Size: 4.0 Number of attempts: 1 Airway Equipment and Method: Bite block Placement Confirmation: positive ETCO2 Tube secured with: Tape Dental Injury: Teeth and Oropharynx as per pre-operative assessment

## 2022-11-08 NOTE — Transfer of Care (Addendum)
Immediate Anesthesia Transfer of Care Note  Patient: Cristina Barton  Procedure(s) Performed: DILATATION AND CURETTAGE /HYSTEROSCOPY/MYOSURE (Uterus) OPERATIVE ULTRASOUND (Abdomen)  Patient Location: PACU  Anesthesia Type:General  Level of Consciousness: drowsy  Airway & Oxygen Therapy: Patient Spontanous Breathing and Patient connected to nasal cannula oxygen  Post-op Assessment: Report given to RN  Post vital signs: Reviewed and stable,repeat  BP 131/72  Last Vitals:  Vitals Value Taken Time  BP 141/103 11/08/22 1200  Temp    Pulse    Resp 12 11/08/22 1200  SpO2    Vitals shown include unvalidated device data.  Last Pain:  Vitals:   11/08/22 [id number]  TempSrc: Oral  PainSc: 0-No pain      Patients Stated Pain Goal: 5 ([id number])  Complications: No notable events documented.

## 2022-11-09 ENCOUNTER — Encounter (HOSPITAL_BASED_OUTPATIENT_CLINIC_OR_DEPARTMENT_OTHER): Payer: Self-pay | Admitting: Obstetrics & Gynecology

## 2022-11-13 LAB — SURGICAL PATHOLOGY

## 2022-11-16 ENCOUNTER — Other Ambulatory Visit (HOSPITAL_BASED_OUTPATIENT_CLINIC_OR_DEPARTMENT_OTHER): Payer: Self-pay | Admitting: Obstetrics & Gynecology

## 2022-11-16 DIAGNOSIS — C549 Malignant neoplasm of corpus uteri, unspecified: Secondary | ICD-10-CM

## 2022-11-19 ENCOUNTER — Telehealth: Payer: Self-pay | Admitting: *Deleted

## 2022-11-19 NOTE — Telephone Encounter (Signed)
Spoke with the patient regarding the referral to GYN oncology. Patient scheduled as new patient with Dr Pricilla Holm on 4/19 at 9:45 am.  Patient given an arrival time of 9:15 am.  Explained to the patient the the doctor will perform a pelvic exam at this visit. Patient given the policy that only one visitor allowed and that visitor must be over 16 yrs are allowed in the Cancer Center. Patient given the address/phone number for the clinic and that the center offers free valet service. Patient aware that masks are option.

## 2022-11-20 ENCOUNTER — Encounter: Payer: Self-pay | Admitting: Gynecologic Oncology

## 2022-11-21 NOTE — Progress Notes (Signed)
GYNECOLOGIC ONCOLOGY NEW PATIENT CONSULTATION   Patient Name: Cristina Barton  Patient Age: 87 y.o. Date of Service: 11/23/22 Referring Provider: Leda Quail, MD  Primary Care Provider: Ailene Ravel, MD Consulting Provider: Eugene Garnet, MD   Assessment/Plan:  Postmenopausal patient with endometrial stromal sarcoma.  The patient presented with her sister today.  We spent some time discussing her presentation with postmenopausal bleeding as well as findings at the time of her recent surgery.  I reviewed in detail pathology report from her D&C.  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  There are portions of the tumor that look high-grade and portions that look low-grade.  Based on what I saw at the time of her hysteroscopy, I am hopeful that the sarcoma was potentially limited to the polypoid lesion within the endometrium but recommend that we proceed with definitive surgical management and staging.  The patient is a suitable candidate for staging via a minimally invasive approach to surgery.  We reviewed that robotic assistance would be used to complete the surgery.  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  A meta-analysis in 2022 showed lymphadenectomy may have a survival benefit in patients with high-grade ESS.  We discussed balancing need for definitive surgery with risk of surgery given patient's age, comorbidities, and memory loss.  Will await CT findings to discuss further.  We reviewed that adjuvant therapy may be recommended based on the patient's biopsy, however, we will defer to final pathology results.  Will plan to send her tumor for genomic testing.  Given her high risk histology, I recommend CT scan preoperatively to rule out metastatic disease.  We discussed the plan for a robotic assisted hysterectomy, bilateral salpingo-oophorectomy,  possible tumor debulking, possible laparotomy. The risks of surgery were discussed in detail and she understands these to include infection; wound separation; hernia; vaginal cuff separation, injury to adjacent organs such as bowel, bladder, blood vessels, ureters and nerves; bleeding which may require blood transfusion; anesthesia risk; thromboembolic events; possible death; unforeseen complications; possible need for re-exploration; medical complications such as heart attack, stroke, pleural effusion and pneumonia; and, if Barton lymphadenectomy is performed the risk of lymphedema and lymphocyst. The patient will receive DVT and antibiotic prophylaxis as indicated. She voiced a clear understanding. She had the opportunity to ask questions. Perioperative instructions were reviewed with her. Prescriptions for post-op medications were sent to her pharmacy of choice.  A copy of this note was sent to the patient's referring provider.   65 minutes of total time was spent for this patient encounter, including preparation, face-to-face counseling with the patient and coordination of care, and documentation of the encounter.  Eugene Garnet, MD  Division of Gynecologic Oncology  Department of Obstetrics and Gynecology  [facility]  ___________________________________________  Chief Complaint: Chief Complaint  Patient presents with   Uterine sarcoma    History of Present Illness:  Cristina Barton is a 87 y.o. y.o. female who is seen in consultation at the request of Dr. Hyacinth Meeker for an evaluation of endometrial stromal sarcoma.  The patient initially presented in late February in the setting of postmenopausal bleeding for 2 weeks.  She denied any associated symptoms.  Ultrasound performed on 2/27 showed a uterus measuring 10.3 x 5.4 x 6.4 cm with multiple uterine leiomyomata, largest measuring 4.9 cm.  Endometrium is obscured by leiomyoma.  Neither ovary visualized.  Endometrial  biopsy performed with mostly blood noted, nondiagnostic.  Patient was counseled on options moving forward and was agreeable to hysteroscopy with endometrial sampling.  On 11/08/2022, the patient was taken to the operating room and underwent hysteroscopy with MyoSure resection of a large endometrial polyp appearing mass filling the endometrial cavity.  Anterior fibroid was also noted distorting the endometrium.  Endometrium itself was atrophic in appearance.  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  Patient presents with her sister today.  She notes overall doing well since the procedure.  She has had some darker brown discharge but denies any bleeding.  Denies any pelvic or abdominal pain.  She reports baseline bowel bladder function.  She denies any change in appetite or weight recently.  She takes weekly methotrexate in the setting of her psoriasis.  She has some memory loss, but she lives on her own.  Both her sister and son live nearby.  PAST MEDICAL HISTORY:  Past Medical History:  Diagnosis Date   Anemia    Arthritis    knees, back   Hyperlipidemia    Hypertension    Memory loss    PMB (postmenopausal bleeding)    Psoriasis    dermatologist---   dr Coralie Carpen;    methotrexate on Mondays   Wears glasses      PAST SURGICAL HISTORY:  Past Surgical History:  Procedure Laterality Date   COLONOSCOPY     DILATION AND CURETTAGE OF UTERUS  11/12/2001    by dr Greta Doom   HYSTEROSCOPY WITH D & C N/A 11/08/2022   Procedure: DILATATION AND CURETTAGE /HYSTEROSCOPY/MYOSURE;  Surgeon: Jerene Bears, MD;  Location: [facility];  Service: Gynecology;  Laterality: N/A;   KNEE ARTHROPLASTY  09/08/2012   Procedure: COMPUTER ASSISTED TOTAL KNEE ARTHROPLASTY;  Surgeon: Eldred Manges, MD;  Location: [facility] OR;  Service: Orthopedics;  Laterality: Left;  Left total knee arthroplasty, cemented   KNEE ARTHROSCOPY Left 03/25/2001    by  dr Demetrius Charity. carter   LUMBAR LAMINECTOMY  03/12/2012   Procedure: MICRODISCECTOMY LUMBAR LAMINECTOMY;  Surgeon: Eldred Manges, MD;  Location: [facility] OR;  Service: Orthopedics;  Laterality: N/A;  L3-4, L4-5 Decompression   OPERATIVE ULTRASOUND N/A 11/08/2022   Procedure: OPERATIVE ULTRASOUND;  Surgeon: Jerene Bears, MD;  Location: [facility];  Service: Gynecology;  Laterality: N/A;   TONSILLECTOMY     child    OB/GYN HISTORY:  OB History  Gravida Para Term Preterm AB Living  1 1          SAB IAB Ectopic Multiple Live Births               # Outcome Date GA Lbr Len/2nd Weight Sex Delivery Anes PTL Lv  1 Para             No LMP recorded. Patient is postmenopausal.  Age at menarche: 89 Age at menopause: Approximately 89 Hx of HRT: Denies Hx of STDs: Denies Last pap: 09/2022 - [facility] History of abnormal pap smears: Denies  SCREENING STUDIES:  Last mammogram: 2018  Last colonoscopy: Unsure  MEDICATIONS: Outpatient Encounter Medications as of 11/23/2022  Medication Sig   betamethasone dipropionate 0.05 % cream APPLY TO AFFECTED AREAS 1-2 TIMES DAILY AS NEEDED. DONT APPLY TO FACE/UNDERARMS/GENITAL SKIN.   calcium carbonate (OSCAL) 1500 (600 Ca) MG TABS tablet Take 1,500 mg by mouth 2 (two) times daily with a meal. Unsure of dosage   ferrous sulfate (  FEROSUL) 325 (65 FE) MG tablet Take 325 mg by mouth daily with breakfast.   folic acid (FOLVITE) 1 MG tablet Take 1 mg by mouth daily. Unsure of dosage   HYDROcodone-acetaminophen (NORCO/VICODIN) 5-325 MG tablet Take 1 tablet by mouth every 6 (six) hours as needed for moderate pain.   methotrexate (RHEUMATREX) 2.5 MG tablet Take 5 mg by mouth once a week. Mondays. Caution:Chemotherapy. Protect from light. /   per pt takes on monday's   metoprolol succinate (TOPROL-XL) 50 MG 24 hr tablet Take 50 mg by mouth daily. Take with or immediately following a meal.   pravastatin (PRAVACHOL) 40 MG tablet Take 40 mg by mouth at bedtime.    senna-docusate (SENOKOT-S) 8.6-50 MG tablet Take 2 tablets by mouth at bedtime. For AFTER surgery, do not take if having diarrhea   traMADol (ULTRAM) 50 MG tablet Take 1 tablet (50 mg total) by mouth every 6 (six) hours as needed for severe pain. For AFTER surgery only, do not take and drive   valsartan (DIOVAN) 80 MG tablet Take 80 mg by mouth daily.   [DISCONTINUED] NON FORMULARY Take 1 tablet by mouth daily. Slow iron   [DISCONTINUED] verapamil (VERELAN PM) 240 MG 24 hr capsule Take 240 mg by mouth at bedtime.   No facility-administered encounter medications on file as of 11/23/2022.    ALLERGIES:  Allergies  Allergen Reactions   Sulfa Antibiotics Rash     FAMILY HISTORY:  Family History  Problem Relation Age of Onset   Breast cancer Mother    Breast cancer Sister    Colon cancer Neg Hx    Ovarian cancer Neg Hx    Endometrial cancer Neg Hx    Pancreatic cancer Neg Hx    Prostate cancer Neg Hx      SOCIAL HISTORY:  Social Connections: Not on file    REVIEW OF SYSTEMS:  Denies appetite changes, fevers, chills, fatigue, unexplained weight changes. Denies hearing loss, neck lumps or masses, mouth sores, ringing in ears or voice changes. Denies cough or wheezing.  Denies shortness of breath. Denies chest pain or palpitations. Denies leg swelling. Denies abdominal distention, pain, blood in stools, constipation, diarrhea, nausea, vomiting, or early satiety. Denies pain with intercourse, dysuria, frequency, hematuria or incontinence. Denies hot flashes, pelvic pain.   Denies joint pain, back pain or muscle pain/cramps. Denies itching, rash, or wounds. Denies dizziness, headaches, numbness or seizures. Denies swollen lymph nodes or glands, denies easy bruising or bleeding. Denies anxiety, depression, confusion, or decreased concentration.  Physical Exam:  Vital Signs for this encounter:  Blood pressure (!) 155/65, pulse 61, weight 170 lb 12.8 oz (77.5 kg), SpO2 98 %. Body  mass index is 30.26 kg/m. General: Alert, oriented, no acute distress.  HEENT: Normocephalic, atraumatic. Sclera anicteric.  Chest: Clear to auscultation bilaterally. No wheezes, rhonchi, or rales. Cardiovascular: Regular rate and rhythm, no murmurs, rubs, or gallops.  Abdomen: Normoactive bowel sounds. Soft, nondistended, nontender to palpation. No masses or hepatosplenomegaly appreciated. No palpable fluid wave.  Extremities: Grossly normal range of motion. Warm, well perfused. No edema bilaterally.  Skin: No rashes or lesions.  Lymphatics: No cervical, supraclavicular, or inguinal adenopathy.  GU:  Normal external female genitalia. No lesions. No discharge or bleeding.             Bladder/urethra:  No lesions or masses, well supported bladder             Vagina: Moderately atrophic, no lesions or masses noted.  Cervix: Normal appearing, no lesions.             Uterus: 10 cm mobile, bulbous, no parametrial involvement or nodularity.             Adnexa: No masses appreciated.  Rectal: Deferred.  LABORATORY AND RADIOLOGIC DATA:  Outside medical records were reviewed to synthesize the above history, along with the history and physical obtained during the visit.   Lab Results  Component Value Date   WBC 7.0 11/08/2022   HGB 13.9 11/08/2022   HCT 41.0 11/08/2022   PLT 227 11/08/2022   GLUCOSE 108 (H) 11/08/2022   ALT 14 09/01/2012   AST 17 09/01/2012   NA 141 11/08/2022   K 4.2 11/08/2022   CL 104 11/08/2022   CREATININE 0.80 11/08/2022   BUN 9 11/08/2022   CO2 30 09/09/2012   INR 1.04 09/01/2012

## 2022-11-23 ENCOUNTER — Inpatient Hospital Stay (HOSPITAL_BASED_OUTPATIENT_CLINIC_OR_DEPARTMENT_OTHER): Payer: Medicare Other | Admitting: Gynecologic Oncology

## 2022-11-23 ENCOUNTER — Other Ambulatory Visit: Payer: Self-pay

## 2022-11-23 ENCOUNTER — Encounter: Payer: Self-pay | Admitting: Gynecologic Oncology

## 2022-11-23 ENCOUNTER — Inpatient Hospital Stay: Payer: Medicare Other | Attending: Gynecologic Oncology | Admitting: Gynecologic Oncology

## 2022-11-23 VITALS — BP 155/65 | HR 61 | Wt 170.8 lb

## 2022-11-23 DIAGNOSIS — Z803 Family history of malignant neoplasm of breast: Secondary | ICD-10-CM | POA: Insufficient documentation

## 2022-11-23 DIAGNOSIS — R413 Other amnesia: Secondary | ICD-10-CM

## 2022-11-23 DIAGNOSIS — Z79899 Other long term (current) drug therapy: Secondary | ICD-10-CM | POA: Insufficient documentation

## 2022-11-23 DIAGNOSIS — L409 Psoriasis, unspecified: Secondary | ICD-10-CM | POA: Insufficient documentation

## 2022-11-23 DIAGNOSIS — F03A0 Unspecified dementia, mild, without behavioral disturbance, psychotic disturbance, mood disturbance, and anxiety: Secondary | ICD-10-CM | POA: Insufficient documentation

## 2022-11-23 DIAGNOSIS — Z7982 Long term (current) use of aspirin: Secondary | ICD-10-CM | POA: Insufficient documentation

## 2022-11-23 DIAGNOSIS — Z7901 Long term (current) use of anticoagulants: Secondary | ICD-10-CM | POA: Insufficient documentation

## 2022-11-23 DIAGNOSIS — I82412 Acute embolism and thrombosis of left femoral vein: Secondary | ICD-10-CM | POA: Insufficient documentation

## 2022-11-23 DIAGNOSIS — E669 Obesity, unspecified: Secondary | ICD-10-CM

## 2022-11-23 DIAGNOSIS — E785 Hyperlipidemia, unspecified: Secondary | ICD-10-CM | POA: Insufficient documentation

## 2022-11-23 DIAGNOSIS — C549 Malignant neoplasm of corpus uteri, unspecified: Secondary | ICD-10-CM

## 2022-11-23 DIAGNOSIS — I10 Essential (primary) hypertension: Secondary | ICD-10-CM | POA: Insufficient documentation

## 2022-11-23 DIAGNOSIS — I2693 Single subsegmental pulmonary embolism without acute cor pulmonale: Secondary | ICD-10-CM | POA: Insufficient documentation

## 2022-11-23 DIAGNOSIS — C541 Malignant neoplasm of endometrium: Secondary | ICD-10-CM

## 2022-11-23 DIAGNOSIS — N950 Postmenopausal bleeding: Secondary | ICD-10-CM | POA: Insufficient documentation

## 2022-11-23 DIAGNOSIS — Z800 Family history of malignant neoplasm of digestive organs: Secondary | ICD-10-CM | POA: Insufficient documentation

## 2022-11-23 MED ORDER — SENNOSIDES-DOCUSATE SODIUM 8.6-50 MG PO TABS
2.0000 | ORAL_TABLET | Freq: Every day | ORAL | 0 refills | Status: DC
Start: 2022-11-23 — End: 2022-11-29

## 2022-11-23 MED ORDER — TRAMADOL HCL 50 MG PO TABS
50.0000 mg | ORAL_TABLET | Freq: Four times a day (QID) | ORAL | 0 refills | Status: DC | PRN
Start: 2022-11-23 — End: 2022-11-29

## 2022-11-23 NOTE — Patient Instructions (Addendum)
Plan to have a CT scan before surgery. For the CT scan, nothing to eat or drink 4 hours before the scan. Arrive 2 hours before your scan to begin drinking contrast for the scan.   We will also reach out to your other physicians/providers to obtain clearance for surgery.  Preparing for your Surgery  Plan for surgery on Dec 12, 2022 with Dr. Eugene Garnet at [facility]. You will be scheduled for robotic assisted total laparoscopic hysterectomy (removal of the uterus and cervix), bilateral salpingo-oophorectomy (removal of both ovaries and fallopian tubes), possible tumor debulking, possible laparotomy (larger incision on your abdomen if needed).    Pre-operative Testing -You will receive a phone call from presurgical testing at [facility] to arrange for a pre-operative appointment and lab work.  -Bring your insurance card, copy of an advanced directive if applicable, medication list  -At that visit, you will be asked to sign a consent for a possible blood transfusion in case a transfusion becomes necessary during surgery.  The need for a blood transfusion is rare but having consent is a necessary part of your care.     -You should not be taking blood thinners or aspirin at least ten days prior to surgery unless instructed by your surgeon.  -Do not take supplements such as fish oil (omega 3), red yeast rice, turmeric before your surgery. You want to avoid medications with aspirin in them including headache powders such as BC or Goody's), Excedrin migraine.  Day Before Surgery at Home -You will be asked to take in a light diet the day before surgery. You will be advised you can have clear liquids up until 3 hours before your surgery.    Eat a light diet the day before surgery.  Examples including soups, broths, toast, yogurt, mashed potatoes.  AVOID GAS PRODUCING FOODS AND BEVERAGES. Things to avoid include carbonated beverages (fizzy beverages, sodas), raw fruits and raw  vegetables (uncooked), or beans.   If your bowels are filled with gas, your surgeon will have difficulty visualizing your pelvic organs which increases your surgical risks.  Your role in recovery Your role is to become active as soon as directed by your doctor, while still giving yourself time to heal.  Rest when you feel tired. You will be asked to do the following in order to speed your recovery:  - Cough and breathe deeply. This helps to clear and expand your lungs and can prevent pneumonia after surgery.  - STAY ACTIVE WHEN YOU GET HOME. Do mild physical activity. Walking or moving your legs help your circulation and body functions return to normal. Do not try to get up or walk alone the first time after surgery.   -If you develop swelling on one leg or the other, pain in the back of your leg, redness/warmth in one of your legs, please call the office or go to the Emergency Room to have a doppler to rule out a blood clot. For shortness of breath, chest pain-seek care in the Emergency Room as soon as possible. - Actively manage your pain. Managing your pain lets you move in comfort. We will ask you to rate your pain on a scale of zero to 10. It is your responsibility to tell your doctor or nurse where and how much you hurt so your pain can be treated.  Special Considerations -If you are diabetic, you may be placed on insulin after surgery to have closer control over your blood sugars to promote  healing and recovery.  This does not mean that you will be discharged on insulin.  If applicable, your oral antidiabetics will be resumed when you are tolerating a solid diet.  -Your final pathology results from surgery should be available around one week after surgery and the results will be relayed to you when available.  -FMLA forms can be faxed to [phone] and please allow 5-7 business days for completion.  Pain Management After Surgery -You have been prescribed your pain medication (tramadol)  and bowel regimen medications before surgery so that you can have these available when you are discharged from the hospital. The pain medication is for use ONLY AFTER surgery and a new prescription will not be given. DO NOT TAKE WITH OTHER PAIN MEDICATIONS LIKE HYDROCODONE/APAP  -Make sure that you have Tylenol IF YOU ARE ABLE TO TAKE THESE MEDICATION at home to use on a regular basis after surgery for pain control.   -Review the attached handout on narcotic use and their risks and side effects.   Bowel Regimen -You have been prescribed Sennakot-S to take nightly to prevent constipation especially if you are taking the narcotic pain medication intermittently.  It is important to prevent constipation and drink adequate amounts of liquids. You can stop taking this medication when you are not taking pain medication and you are back on your normal bowel routine.  Risks of Surgery Risks of surgery are low but include bleeding, infection, damage to surrounding structures, re-operation, blood clots, and very rarely death.   Blood Transfusion Information (For the consent to be signed before surgery)  We will be checking your blood type before surgery so in case of emergencies, we will know what type of blood you would need.                                            WHAT IS A BLOOD TRANSFUSION?  A transfusion is the replacement of blood or some of its parts. Blood is made up of multiple cells which provide different functions. Red blood cells carry oxygen and are used for blood loss replacement. White blood cells fight against infection. Platelets control bleeding. Plasma helps clot blood. Other blood products are available for specialized needs, such as hemophilia or other clotting disorders. BEFORE THE TRANSFUSION  Who gives blood for transfusions?  You may be able to donate blood to be used at a later date on yourself (autologous donation). Relatives can be asked to donate blood. This is  generally not any safer than if you have received blood from a stranger. The same precautions are taken to ensure safety when a relative's blood is donated. Healthy volunteers who are fully evaluated to make sure their blood is safe. This is blood bank blood. Transfusion therapy is the safest it has ever been in the practice of medicine. Before blood is taken from a donor, a complete history is taken to make sure that person has no history of diseases nor engages in risky social behavior (examples are intravenous drug use or sexual activity with multiple partners). The donor's travel history is screened to minimize risk of transmitting infections, such as malaria. The donated blood is tested for signs of infectious diseases, such as HIV and hepatitis. The blood is then tested to be sure it is compatible with you in order to minimize the chance of a transfusion reaction. If you  or a relative donates blood, this is often done in anticipation of surgery and is not appropriate for emergency situations. It takes many days to process the donated blood. RISKS AND COMPLICATIONS Although transfusion therapy is very safe and saves many lives, the main dangers of transfusion include:  Getting an infectious disease. Developing a transfusion reaction. This is an allergic reaction to something in the blood you were given. Every precaution is taken to prevent this. The decision to have a blood transfusion has been considered carefully by your caregiver before blood is given. Blood is not given unless the benefits outweigh the risks.  AFTER SURGERY INSTRUCTIONS  Return to work: 4-6 weeks if applicable  Activity: 1. Be up and out of the bed during the day.  Take a nap if needed.  You may walk up steps but be careful and use the hand rail.  Stair climbing will tire you more than you think, you may need to stop part way and rest.   2. No lifting or straining for 6 weeks over 10 pounds. No pushing, pulling, straining  for 6 weeks.  3. No driving for around 1 week(s).  Do not drive if you are taking narcotic pain medicine and make sure that your reaction time has returned.   4. You can shower as soon as the next day after surgery. Shower daily.  Use your regular soap and water (not directly on the incision) and pat your incision(s) dry afterwards; don't rub.  No tub baths or submerging your body in water until cleared by your surgeon. If you have the soap that was given to you by pre-surgical testing that was used before surgery, you do not need to use it afterwards because this can irritate your incisions.   5. No sexual activity and nothing in the vagina for 10-12 weeks.  6. You may experience a small amount of clear drainage from your incisions, which is normal.  If the drainage persists, increases, or changes color please call the office.  7. Do not use creams, lotions, or ointments such as neosporin on your incisions after surgery until advised by your surgeon because they can cause removal of the dermabond glue on your incisions.    8. You may experience vaginal spotting after surgery or around the 6-8 week mark from surgery when the stitches at the top of the vagina begin to dissolve.  The spotting is normal but if you experience heavy bleeding, call our office.  9. Take Tylenol first for pain if you are able to take these medications and only use narcotic pain medication for severe pain not relieved by the Tylenol.  Monitor your Tylenol intake to a max of 4,000 mg in a 24 hour period.   Diet: 1. Low sodium Heart Healthy Diet is recommended but you are cleared to resume your normal (before surgery) diet after your procedure.  2. It is safe to use a laxative, such as Miralax or Colace, if you have difficulty moving your bowels. You have been prescribed Sennakot-S to take at bedtime every evening after surgery to keep bowel movements regular and to prevent constipation.    Wound Care: 1. Keep clean and  dry.  Shower daily.  Reasons to call the Doctor: Fever - Oral temperature greater than 100.4 degrees Fahrenheit Foul-smelling vaginal discharge Difficulty urinating Nausea and vomiting Increased pain at the site of the incision that is unrelieved with pain medicine. Difficulty breathing with or without chest pain New calf pain especially if  only on one side Sudden, continuing increased vaginal bleeding with or without clots.   Contacts: For questions or concerns you should contact:  Dr. Eugene Garnet at [phone]  Warner Mccreedy, NP at [phone]  After Hours: call [phone] and have the GYN Oncologist paged/contacted (after 5 pm or on the weekends). You will speak with an after hours RN and let he or she know you have had surgery.  Messages sent via mychart are for non-urgent matters and are not responded to after hours so for urgent needs, please call the after hours number.

## 2022-11-27 ENCOUNTER — Ambulatory Visit (HOSPITAL_COMMUNITY)
Admission: RE | Admit: 2022-11-27 | Discharge: 2022-11-27 | Disposition: A | Discharge status: Home / Self Care | Payer: Medicare Other | Source: Ambulatory Visit | Attending: Gynecologic Oncology | Admitting: Gynecologic Oncology

## 2022-11-27 ENCOUNTER — Other Ambulatory Visit: Payer: Self-pay

## 2022-11-27 ENCOUNTER — Telehealth: Payer: Self-pay | Admitting: *Deleted

## 2022-11-27 ENCOUNTER — Encounter (HOSPITAL_COMMUNITY): Payer: Self-pay

## 2022-11-27 ENCOUNTER — Observation Stay (HOSPITAL_COMMUNITY)
Admission: EM | Admit: 2022-11-27 | Discharge: 2022-11-28 | Disposition: A | Discharge status: Home / Self Care | Payer: Medicare Other | Attending: Internal Medicine | Admitting: Internal Medicine

## 2022-11-27 ENCOUNTER — Telehealth: Payer: Self-pay | Admitting: Psychiatry

## 2022-11-27 DIAGNOSIS — N281 Cyst of kidney, acquired: Secondary | ICD-10-CM | POA: Diagnosis not present

## 2022-11-27 DIAGNOSIS — I10 Essential (primary) hypertension: Secondary | ICD-10-CM | POA: Diagnosis not present

## 2022-11-27 DIAGNOSIS — I2609 Other pulmonary embolism with acute cor pulmonale: Secondary | ICD-10-CM | POA: Diagnosis not present

## 2022-11-27 DIAGNOSIS — I2699 Other pulmonary embolism without acute cor pulmonale: Secondary | ICD-10-CM | POA: Diagnosis not present

## 2022-11-27 DIAGNOSIS — R0602 Shortness of breath: Secondary | ICD-10-CM | POA: Diagnosis present

## 2022-11-27 DIAGNOSIS — Z7901 Long term (current) use of anticoagulants: Secondary | ICD-10-CM | POA: Insufficient documentation

## 2022-11-27 DIAGNOSIS — C541 Malignant neoplasm of endometrium: Secondary | ICD-10-CM | POA: Insufficient documentation

## 2022-11-27 DIAGNOSIS — Z96652 Presence of left artificial knee joint: Secondary | ICD-10-CM | POA: Insufficient documentation

## 2022-11-27 DIAGNOSIS — Z79899 Other long term (current) drug therapy: Secondary | ICD-10-CM | POA: Diagnosis not present

## 2022-11-27 DIAGNOSIS — Z8542 Personal history of malignant neoplasm of other parts of uterus: Secondary | ICD-10-CM | POA: Insufficient documentation

## 2022-11-27 DIAGNOSIS — Z7982 Long term (current) use of aspirin: Secondary | ICD-10-CM | POA: Insufficient documentation

## 2022-11-27 DIAGNOSIS — K5730 Diverticulosis of large intestine without perforation or abscess without bleeding: Secondary | ICD-10-CM | POA: Diagnosis not present

## 2022-11-27 LAB — CBC WITH DIFFERENTIAL/PLATELET
Abs Immature Granulocytes: 0.01 10*3/uL (ref 0.00–0.07)
Basophils Absolute: 0 10*3/uL (ref 0.0–0.1)
Basophils Relative: 1 %
Eosinophils Absolute: 0.1 10*3/uL (ref 0.0–0.5)
Eosinophils Relative: 2 %
HCT: 40.6 % (ref 36.0–46.0)
Hemoglobin: 13.2 g/dL (ref 12.0–15.0)
Immature Granulocytes: 0 %
Lymphocytes Relative: 29 %
Lymphs Abs: 1.9 10*3/uL (ref 0.7–4.0)
MCH: 29.9 pg (ref 26.0–34.0)
MCHC: 32.5 g/dL (ref 30.0–36.0)
MCV: 91.9 fL (ref 80.0–100.0)
Monocytes Absolute: 0.5 10*3/uL (ref 0.1–1.0)
Monocytes Relative: 7 %
Neutro Abs: 4.1 10*3/uL (ref 1.7–7.7)
Neutrophils Relative %: 61 %
Platelets: 166 10*3/uL (ref 150–400)
RBC: 4.42 MIL/uL (ref 3.87–5.11)
RDW: 13.2 % (ref 11.5–15.5)
WBC: 6.6 10*3/uL (ref 4.0–10.5)
nRBC: 0 % (ref 0.0–0.2)

## 2022-11-27 LAB — PROTIME-INR
INR: 1.1 (ref 0.8–1.2)
Prothrombin Time: 14.1 seconds (ref 11.4–15.2)

## 2022-11-27 LAB — BASIC METABOLIC PANEL
Anion gap: 7 (ref 5–15)
BUN: 9 mg/dL (ref 8–23)
CO2: 26 mmol/L (ref 22–32)
Calcium: 9.8 mg/dL (ref 8.9–10.3)
Chloride: 102 mmol/L (ref 98–111)
Creatinine, Ser: 0.93 mg/dL (ref 0.44–1.00)
GFR, Estimated: >60 mL/min (ref >60)
Glucose, Bld: 119 mg/dL — ABNORMAL HIGH (ref 70–99)
Potassium: 3.7 mmol/L (ref 3.5–5.1)
Sodium: 135 mmol/L (ref 135–145)

## 2022-11-27 LAB — APTT: aPTT: 29 seconds (ref 24–36)

## 2022-11-27 LAB — TROPONIN I (HIGH SENSITIVITY): Troponin I (High Sensitivity): 11 ng/L (ref <18)

## 2022-11-27 MED ORDER — SODIUM CHLORIDE (PF) 0.9 % IJ SOLN
INTRAMUSCULAR | Status: AC
  Filled 2022-11-27: qty 50

## 2022-11-27 MED ORDER — PRAVASTATIN SODIUM 20 MG PO TABS
40.0000 mg | ORAL_TABLET | Freq: Every day | ORAL | Status: DC
  Administered 2022-11-27: 40 mg via ORAL
  Filled 2022-11-27: qty 2

## 2022-11-27 MED ORDER — HEPARIN (PORCINE) 25000 UT/250ML-% IV SOLN
900.0000 [IU]/h | INTRAVENOUS | Status: AC
  Administered 2022-11-27: 1000 [IU]/h via INTRAVENOUS
  Filled 2022-11-27: qty 250

## 2022-11-27 MED ORDER — HEPARIN BOLUS VIA INFUSION
2000.0000 [IU] | Freq: Once | INTRAVENOUS | Status: AC
  Administered 2022-11-27: 2000 [IU] via INTRAVENOUS
  Filled 2022-11-27: qty 2000

## 2022-11-27 MED ORDER — IRBESARTAN 75 MG PO TABS
75.0000 mg | ORAL_TABLET | Freq: Every day | ORAL | Status: DC
  Administered 2022-11-27 – 2022-11-28 (×2): 75 mg via ORAL
  Filled 2022-11-27 (×2): qty 1

## 2022-11-27 MED ORDER — METOPROLOL SUCCINATE ER 50 MG PO TB24
50.0000 mg | ORAL_TABLET | Freq: Every day | ORAL | Status: DC
  Administered 2022-11-27 – 2022-11-28 (×2): 50 mg via ORAL
  Filled 2022-11-27 (×2): qty 1

## 2022-11-27 MED ORDER — IOHEXOL 300 MG/ML  SOLN
100.0000 mL | Freq: Once | INTRAMUSCULAR | Status: AC | PRN
  Administered 2022-11-27: 100 mL via INTRAVENOUS

## 2022-11-27 NOTE — Progress Notes (Signed)
ANTICOAGULATION CONSULT NOTE - Initial Consult  Pharmacy Consult for Heparin IV Indication: pulmonary embolus  Allergies  Allergen Reactions   Sulfa Antibiotics Rash    Patient Measurements: Height:  (160 cm) Weight: 77 kg (169 lb 12.1 oz) IBW/kg (Calculated) : 52.4 Heparin Dosing Weight: 69 kg  Vital Signs: Temp: 97.7 F (36.5 C) (04/23 1914) Temp Source: Oral (04/23 1914) BP: 198/85 (04/23 1914) Pulse Rate: 93 (04/23 1914)  Labs: Recent Labs    11/27/22 2012  HGB 13.2  HCT 40.6  PLT 166  APTT 29  LABPROT 14.1  INR 1.1  CREATININE 0.93  TROPONINIHS 11    Estimated Creatinine Clearance: 43.4 mL/min (by C-G formula based on SCr of 0.93 mg/dL).   Medical History: Past Medical History:  Diagnosis Date   Anemia    Arthritis    knees, back   Hyperlipidemia    Hypertension    Memory loss    PMB (postmenopausal bleeding)    Psoriasis    dermatologist---   dr Coralie Carpen;    methotrexate on Mondays   Wears glasses     Medications:  No anticoagulation PTA  Assessment: Patient with recent diagnosis of a stromal endometrial tumor with plans of removal and was having the typical scans today. Patient has no specific complaints. However on CT today she was found to have a right sided central and segmental pulmonary embolism with no evidence of right heart strain. Patient at this time takes no anticoagulation.   Goal of Therapy:  Heparin level 0.3-0.7 units/ml Monitor platelets by anticoagulation protocol: Yes  Today, 11/27/22: - Hgb: 13.2 g/dL - UJW:119 K/uL   Plan:  - Give 2000 unit bolus x 1 - Start heparin infusion at 1000 units/hr - Check Heparin Level in 8 hours and daily while on heparin - Daily CBC  - Continue to monitor H&H and platelets - Monitor Closely for signs/symptoms of bleeding  Cammie Faulstich Tylene Fantasia 11/27/2022,8:56 PM

## 2022-11-27 NOTE — ED Provider Notes (Signed)
[place] EMERGENCY DEPARTMENT AT [facility] Provider Note   CSN: [id number] Arrival date & time: 11/27/22  1859     History  Chief Complaint  Patient presents with   Shortness of Breath    Cristina Barton is a 87 y.o. female.  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  Patient reports that she was here today to get full body scans done prior to her surgery.  She has been having some intermittent vaginal bleeding but reports they called her today and told her she had a blood clot in her lung and she needed to return.  Patient denies any chest pain or shortness of breath.  She has not had any syncope and denies feeling winded with ambulating.  She has no prior history of blood clots and takes no anticoagulation.  Denies any unilateral leg pain or swelling.  The history is provided by the patient, medical records and a relative.  Shortness of Breath      Home Medications Prior to Admission medications   Medication Sig Start Date End Date Taking? Authorizing Provider  aspirin EC 81 MG tablet Take 81 mg by mouth daily. Swallow whole.    [provider]  betamethasone dipropionate 0.05 % cream APPLY TO AFFECTED AREAS 1-2 TIMES DAILY AS NEEDED. DONT APPLY TO FACE/UNDERARMS/GENITAL SKIN. 10/26/22   [provider]  calcium carbonate (OSCAL) 1500 (600 Ca) MG TABS tablet Take 1,500 mg by mouth 2 (two) times daily with a meal. Unsure of dosage    [provider]  ferrous sulfate (FEROSUL) 325 (65 FE) MG tablet Take 325 mg by mouth daily with breakfast.    [provider]  folic acid (FOLVITE) 1 MG tablet Take 1 mg by mouth daily. Unsure of dosage    [provider]  HYDROcodone-acetaminophen (NORCO/VICODIN) 5-325 MG tablet Take 1 tablet by mouth every 6 (six) hours as needed for moderate pain. 11/08/22   Jerene Bears, MD   methotrexate (RHEUMATREX) 2.5 MG tablet Take 5 mg by mouth once a week. Mondays. Caution:Chemotherapy. Protect from light. /   per pt takes on monday's    [provider]  metoprolol succinate (TOPROL-XL) 50 MG 24 hr tablet Take 50 mg by mouth daily. Take with or immediately following a meal.    [provider]  pravastatin (PRAVACHOL) 40 MG tablet Take 40 mg by mouth at bedtime.    [provider]  senna-docusate (SENOKOT-S) 8.6-50 MG tablet Take 2 tablets by mouth at bedtime. For AFTER surgery, do not take if having diarrhea 11/23/22   Warner Mccreedy D, NP  traMADol (ULTRAM) 50 MG tablet Take 1 tablet (50 mg total) by mouth every 6 (six) hours as needed for severe pain. For AFTER surgery only, do not take and drive 11/13/79   Cross, Efraim Kaufmann D, NP  valsartan (DIOVAN) 80 MG tablet Take 80 mg by mouth daily.    [provider]      Allergies    Sulfa antibiotics    Review of Systems   Review of Systems  Respiratory:  Positive for shortness of breath.     Physical Exam Updated Vital Signs BP (!) 198/85 (BP Location: Left Arm)   Pulse 93   Temp 97.7 F (36.5 C) (Oral)   Resp 18   Ht 5\' 3"  (1.6 m)   Wt 77 kg   SpO2 99%  BMI 30.07 kg/m  Physical Exam Vitals and nursing note reviewed.  Constitutional:      General: She is not in acute distress.    Appearance: She is well-developed.  HENT:     Head: Normocephalic and atraumatic.  Eyes:     Pupils: Pupils are equal, round, and reactive to light.  Cardiovascular:     Rate and Rhythm: Normal rate and regular rhythm.     Heart sounds: Murmur heard.     Systolic murmur is present with a grade of 2/6.     No friction rub.  Pulmonary:     Effort: Pulmonary effort is normal.     Breath sounds: Normal breath sounds. No wheezing or rales.  Abdominal:     General: Bowel sounds are normal. There is no distension.     Palpations: Abdomen is soft.     Tenderness: There is no abdominal tenderness. There  is no guarding or rebound.  Musculoskeletal:        General: No tenderness. Normal range of motion.     Left lower leg: Edema present.     Comments: Mild swelling noted of the left lower extremity compared to the right with minimal pitting edema around the ankle.  No tenderness with palpation  Skin:    General: Skin is warm and dry.     Findings: No rash.  Neurological:     Mental Status: She is alert and oriented to person, place, and time.     Cranial Nerves: No cranial nerve deficit.  Psychiatric:        Behavior: Behavior normal.     ED Results / Procedures / Treatments   Labs (all labs ordered are listed, but only abnormal results are displayed) Labs Reviewed  BASIC METABOLIC PANEL - Abnormal; Notable for the following components:      Result Value   Glucose, Bld 119 (*)    All other components within normal limits  CBC WITH DIFFERENTIAL/PLATELET  PROTIME-INR  APTT  TROPONIN I (HIGH SENSITIVITY)    EKG None  Radiology CT CHEST ABDOMEN PELVIS W CONTRAST  Result Date: 11/27/2022 CLINICAL DATA:  Recent diagnosis of endometrial stromal sarcoma, staging evaluation EXAM: CT CHEST, ABDOMEN, AND PELVIS WITH CONTRAST TECHNIQUE: Multidetector CT imaging of the chest, abdomen and pelvis was performed following the standard protocol during bolus administration of intravenous contrast. RADIATION DOSE REDUCTION: This exam was performed according to the departmental dose-optimization program which includes automated exposure control, adjustment of the mA and/or kV according to patient size and/or use of iterative reconstruction technique. CONTRAST:  OMNIPAQUE IOHEXOL 300 MG/ML  SOLN COMPARISON:  10/02/2022 FINDINGS: CT CHEST FINDINGS Cardiovascular: While not optimized for the opacification of the pulmonary vasculature, incidental right-sided pulmonary emboli are identified. Pulmonary emboli extend from the right main pulmonary artery into the right upper and lower lobe segmental and  subsegmental branches. Mild to moderate clot burden with no evidence of right heart strain. The heart is otherwise unremarkable without pericardial effusion. No evidence of thoracic aortic aneurysm or dissection. Atherosclerosis of the aorta and coronary vasculature. Mediastinum/Nodes: No pathologic mediastinal, hilar, or axillary adenopathy. Trachea and esophagus are unremarkable. Lungs/Pleura: No acute airspace disease, effusion, or pneumothorax. Central airways are patent. Musculoskeletal: There are no acute or destructive bony lesions. Reconstructed images demonstrate no additional findings. CT ABDOMEN PELVIS FINDINGS Hepatobiliary: 2.1 cm simple cyst within the left lobe liver. No other focal liver abnormalities. No biliary duct dilation. Gallbladder is decompressed, with no evidence of cholelithiasis or  cholecystitis. Pancreas: Unremarkable. No pancreatic ductal dilatation or surrounding inflammatory changes. Spleen: Normal in size without focal abnormality. Adrenals/Urinary Tract: Simple appearing 1.9 cm cyst within the mid right kidney does not require specific imaging follow-up. Otherwise the kidneys enhance normally. No urinary tract calculi or obstructive uropathy. The adrenals and bladder are grossly unremarkable. Stomach/Bowel: No bowel obstruction or ileus. Normal appendix right lower quadrant. Diffuse colonic diverticulosis without evidence of acute diverticulitis. No bowel wall thickening or inflammatory change. Vascular/Lymphatic: Atherosclerosis of the abdominal aorta and its branches. This is most pronounced within the bilateral renal arteries, with at least moderate ostial stenosis suspected bilaterally. No pathologic adenopathy within the abdomen or pelvis. Reproductive: Heterogeneous 5.4 x 7.6 x 5.8 cm mass within the uterine fundus, may reflect a combination of uterine fibroids and the endometrial stromal sarcoma referenced in the clinical history. There is no evidence of extra uterine  extension. There are no adnexal masses. Other: No free fluid or free intraperitoneal gas. No abdominal wall hernia. Musculoskeletal: No acute or destructive bony lesions. Reconstructed images demonstrate no additional findings. IMPRESSION: 1. Incidental right-sided central and segmental pulmonary emboli. No evidence of right heart strain. 2. Heterogeneous mass within the uterine fundus, likely a combination of uterine fibroids and the endometrial stromal sarcoma reference in the clinical history. No evidence of extra uterine extension of disease. 3. No evidence of intrathoracic, intra-abdominal, or intrapelvic metastases. 4. Colonic diverticulosis without acute diverticulitis. 5. Aortic atherosclerosis. At least moderate ostial stenosis of the bilateral renal arteries. If further evaluation is clinically indicated, CT angiography could be performed. Critical Value/emergent results were called by telephone at the time of interpretation on 11/27/2022 at 5:48 pm to provider Eugene Garnet , who verbally acknowledged these results. Electronically Signed   By: Sharlet Salina M.D.   On: 11/27/2022 17:53    Procedures Procedures    Medications Ordered in ED Medications - No data to display  ED Course/ Medical Decision Making/ A&P                             Medical Decision Making Amount and/or Complexity of Data Reviewed Independent Historian: caregiver External Data Reviewed: notes.    Details: radiology Labs: ordered. Decision-making details documented in ED Course. Radiology: ordered and independent interpretation performed. Decision-making details documented in ED Course.  Risk Decision regarding hospitalization.   Pt with multiple medical problems and comorbidities and presenting today with a complaint that caries a high risk for morbidity and mortality.  Here today due to abnormal CT findings.  Patient with recent diagnosis of a stromal endometrial tumor with plans of removal and was having  the typical scans today.  Patient has no specific complaints.  However on CT today she was found to have a right sided central and segmental pulmonary embolism with no evidence of right heart strain.  Patient at this time takes no anticoagulation.  She denies any chest pain.  She has no tachycardia.  Will start patient on a heparin drip and admit for further evaluation.  I independently interpreted patient's labs CBC, coags, CMP and troponin all within normal limits.  CRITICAL CARE Performed by: Taveon Enyeart Total critical care time: 30 minutes Critical care time was exclusive of separately billable procedures and treating other patients. Critical care was necessary to treat or prevent imminent or life-threatening deterioration. Critical care was time spent personally by me on the following activities: development of treatment plan with patient and/or surrogate as  well as nursing, discussions with consultants, evaluation of patient's response to treatment, examination of patient, obtaining history from patient or surrogate, ordering and performing treatments and interventions, ordering and review of laboratory studies, ordering and review of radiographic studies, pulse oximetry and re-evaluation of patient's condition.          Final Clinical Impression(s) / ED Diagnoses Final diagnoses:  Acute pulmonary embolism without acute cor pulmonale, unspecified pulmonary embolism type    Rx / DC Orders ED Discharge Orders     None         Gwyneth Sprout, MD 11/27/22 2051

## 2022-11-27 NOTE — Telephone Encounter (Signed)
Notified of pt's CT results. Called pt's number. Pt's sister answered. Made her aware of pulmonary embolism noted on CT. Recommend she come to the emergency department for evaluation and anticoagulation. She is understanding of the recommendation and will get her sister to the ED. Acute symptoms reviewed that would benefit from calling EMS for more urgent stabilization but pt is currently asymptomatic and reasonable to return with her sister.

## 2022-11-27 NOTE — ED Triage Notes (Signed)
Patient reports she was here today for CT scan outpatient prior to hysterectomy and they called her this evening and told her she has a blood clot in her lung. Denies any SOB or symptoms. +PE noted on scan.

## 2022-11-27 NOTE — ED Notes (Signed)
ED TO INPATIENT HANDOFF REPORT  ED Nurse Name and Phone #: Deon Pilling [phone]  S Name/Age/Gender Cristina Barton 87 y.o. female Room/Bed: WA14/WA14  Code Status   Code Status: Full Code  Home/SNF/Other Home Patient oriented to: self, place, time, and situation Is this baseline? Yes   Triage Complete: Triage complete  Chief Complaint Acute pulmonary embolus [I26.99]  Triage Note Patient reports she was here today for CT scan outpatient prior to hysterectomy and they called her this evening and told her she has a blood clot in her lung. Denies any SOB or symptoms. +PE noted on scan.   Allergies Allergies  Allergen Reactions   Sulfa Antibiotics Rash    Level of Care/Admitting Diagnosis ED Disposition     ED Disposition  Admit   Condition  --   Comment  Hospital Area: [facility] [[id number]]  Level of Care: Telemetry [5]  Admit to tele based on following criteria: Other see comments  Comments: PE  May place patient in observation at [facility] or [facility] if equivalent level of care is available:: No  Covid Evaluation: Asymptomatic - no recent exposure (last 10 days) testing not required  Diagnosis: Acute pulmonary embolus [454098]  Admitting Physician: Anselm Jungling [[id number]]  Attending Physician: Anselm Jungling [[id number]]          B Medical/Surgery History Past Medical History:  Diagnosis Date   Anemia    Arthritis    knees, back   Hyperlipidemia    Hypertension    Memory loss    PMB (postmenopausal bleeding)    Psoriasis    dermatologist---   dr Coralie Carpen;    methotrexate on Mondays   Wears glasses    Past Surgical History:  Procedure Laterality Date   COLONOSCOPY     DILATION AND CURETTAGE OF UTERUS  11/12/2001   @[facility]  by dr Greta Doom   HYSTEROSCOPY WITH D & C N/A 11/08/2022   Procedure: DILATATION AND CURETTAGE /HYSTEROSCOPY/MYOSURE;  Surgeon: Jerene Bears, MD;  Location: [facility];  Service: Gynecology;  Laterality: N/A;    KNEE ARTHROPLASTY  09/08/2012   Procedure: COMPUTER ASSISTED TOTAL KNEE ARTHROPLASTY;  Surgeon: Eldred Manges, MD;  Location: [facility] OR;  Service: Orthopedics;  Laterality: Left;  Left total knee arthroplasty, cemented   KNEE ARTHROSCOPY Left 03/25/2001   @[facility]  by dr Demetrius Charity. carter   LUMBAR LAMINECTOMY  03/12/2012   Procedure: MICRODISCECTOMY LUMBAR LAMINECTOMY;  Surgeon: Eldred Manges, MD;  Location: [facility] OR;  Service: Orthopedics;  Laterality: N/A;  L3-4, L4-5 Decompression   OPERATIVE ULTRASOUND N/A 11/08/2022   Procedure: OPERATIVE ULTRASOUND;  Surgeon: Jerene Bears, MD;  Location: [facility];  Service: Gynecology;  Laterality: N/A;   TONSILLECTOMY     child     A IV Location/Drains/Wounds Patient Lines/Drains/Airways Status     Active Line/Drains/Airways     Name Placement date Placement time Site Days   Peripheral IV 11/27/22 20 G Left Antecubital 11/27/22  1943  Antecubital  less than 1            Intake/Output Last 24 hours No intake or output data in the 24 hours ending 11/27/22 2203  Labs/Imaging Results for orders placed or performed during the hospital encounter of 11/27/22 (from the past 48 hour(s))  CBC with Differential/Platelet     Status: None   Collection Time: 11/27/22  8:12 PM  Result Value Ref Range   WBC 6.6 4.0 - 10.5 K/uL  RBC 4.42 3.87 - 5.11 MIL/uL   Hemoglobin 13.2 12.0 - 15.0 g/dL   HCT 16.1 09.6 - 04.5 %   MCV 91.9 80.0 - 100.0 fL   MCH 29.9 26.0 - 34.0 pg   MCHC 32.5 30.0 - 36.0 g/dL   RDW 40.9 81.1 - 91.4 %   Platelets 166 150 - 400 K/uL   nRBC 0.0 0.0 - 0.2 %   Neutrophils Relative % 61 %   Neutro Abs 4.1 1.7 - 7.7 K/uL   Lymphocytes Relative 29 %   Lymphs Abs 1.9 0.7 - 4.0 K/uL   Monocytes Relative 7 %   Monocytes Absolute 0.5 0.1 - 1.0 K/uL   Eosinophils Relative 2 %   Eosinophils Absolute 0.1 0.0 - 0.5 K/uL   Basophils Relative 1 %   Basophils Absolute 0.0 0.0 - 0.1 K/uL   Immature Granulocytes 0 %   Abs Immature  Granulocytes 0.01 0.00 - 0.07 K/uL    Comment: Performed at [facility], [address], [place], [place] [address]  Basic metabolic panel     Status: Abnormal   Collection Time: 11/27/22  8:12 PM  Result Value Ref Range   Sodium 135 135 - 145 mmol/L   Potassium 3.7 3.5 - 5.1 mmol/L   Chloride 102 98 - 111 mmol/L   CO2 26 22 - 32 mmol/L   Glucose, Bld 119 (H) 70 - 99 mg/dL    Comment: Glucose reference range applies only to samples taken after fasting for at least 8 hours.   BUN 9 8 - 23 mg/dL   Creatinine, Ser 6.21 0.44 - 1.00 mg/dL   Calcium 9.8 8.9 - 30.8 mg/dL   GFR, Estimated >65 >78 mL/min    Comment: (NOTE) Calculated using the CKD-EPI Creatinine Equation (2021)    Anion gap 7 5 - 15    Comment: Performed at [facility], [address] [place], [place] [address]  Troponin I (High Sensitivity)     Status: None   Collection Time: 11/27/22  8:12 PM  Result Value Ref Range   Troponin I (High Sensitivity) 11 <18 ng/L    Comment: (NOTE) Elevated high sensitivity troponin I (hsTnI) values and significant  changes across serial measurements may suggest ACS but many other  chronic and acute conditions are known to elevate hsTnI results.  Refer to the "Links" section for chest pain algorithms and additional  guidance. Performed at [facility], [address], [place], [place] [address]   Protime-INR     Status: None   Collection Time: 11/27/22  8:12 PM  Result Value Ref Range   Prothrombin Time 14.1 11.4 - 15.2 seconds   INR 1.1 0.8 - 1.2    Comment: (NOTE) INR goal varies based on device and disease states. Performed at [facility], [address], [place], [place] [address]   APTT     Status: None   Collection Time: 11/27/22  8:12 PM  Result Value Ref Range   aPTT 29 24 - 36 seconds    Comment: Performed at [facility], [address] [place], [place] [address]   CT  CHEST ABDOMEN PELVIS W CONTRAST  Result Date: 11/27/2022 CLINICAL DATA:  Recent diagnosis of endometrial stromal sarcoma, staging evaluation EXAM: CT CHEST, ABDOMEN, AND PELVIS WITH CONTRAST TECHNIQUE: Multidetector CT imaging of the chest, abdomen and pelvis was performed following the standard protocol during bolus administration of intravenous contrast. RADIATION DOSE REDUCTION: This  exam was performed according to the departmental dose-optimization program which includes automated exposure control, adjustment of the mA and/or kV according to patient size and/or use of iterative reconstruction technique. CONTRAST:  OMNIPAQUE IOHEXOL 300 MG/ML  SOLN COMPARISON:  10/02/2022 FINDINGS: CT CHEST FINDINGS Cardiovascular: While not optimized for the opacification of the pulmonary vasculature, incidental right-sided pulmonary emboli are identified. Pulmonary emboli extend from the right main pulmonary artery into the right upper and lower lobe segmental and subsegmental branches. Mild to moderate clot burden with no evidence of right heart strain. The heart is otherwise unremarkable without pericardial effusion. No evidence of thoracic aortic aneurysm or dissection. Atherosclerosis of the aorta and coronary vasculature. Mediastinum/Nodes: No pathologic mediastinal, hilar, or axillary adenopathy. Trachea and esophagus are unremarkable. Lungs/Pleura: No acute airspace disease, effusion, or pneumothorax. Central airways are patent. Musculoskeletal: There are no acute or destructive bony lesions. Reconstructed images demonstrate no additional findings. CT ABDOMEN PELVIS FINDINGS Hepatobiliary: 2.1 cm simple cyst within the left lobe liver. No other focal liver abnormalities. No biliary duct dilation. Gallbladder is decompressed, with no evidence of cholelithiasis or cholecystitis. Pancreas: Unremarkable. No pancreatic ductal dilatation or surrounding inflammatory changes. Spleen: Normal in size without focal  abnormality. Adrenals/Urinary Tract: Simple appearing 1.9 cm cyst within the mid right kidney does not require specific imaging follow-up. Otherwise the kidneys enhance normally. No urinary tract calculi or obstructive uropathy. The adrenals and bladder are grossly unremarkable. Stomach/Bowel: No bowel obstruction or ileus. Normal appendix right lower quadrant. Diffuse colonic diverticulosis without evidence of acute diverticulitis. No bowel wall thickening or inflammatory change. Vascular/Lymphatic: Atherosclerosis of the abdominal aorta and its branches. This is most pronounced within the bilateral renal arteries, with at least moderate ostial stenosis suspected bilaterally. No pathologic adenopathy within the abdomen or pelvis. Reproductive: Heterogeneous 5.4 x 7.6 x 5.8 cm mass within the uterine fundus, may reflect a combination of uterine fibroids and the endometrial stromal sarcoma referenced in the clinical history. There is no evidence of extra uterine extension. There are no adnexal masses. Other: No free fluid or free intraperitoneal gas. No abdominal wall hernia. Musculoskeletal: No acute or destructive bony lesions. Reconstructed images demonstrate no additional findings. IMPRESSION: 1. Incidental right-sided central and segmental pulmonary emboli. No evidence of right heart strain. 2. Heterogeneous mass within the uterine fundus, likely a combination of uterine fibroids and the endometrial stromal sarcoma reference in the clinical history. No evidence of extra uterine extension of disease. 3. No evidence of intrathoracic, intra-abdominal, or intrapelvic metastases. 4. Colonic diverticulosis without acute diverticulitis. 5. Aortic atherosclerosis. At least moderate ostial stenosis of the bilateral renal arteries. If further evaluation is clinically indicated, CT angiography could be performed. Critical Value/emergent results were called by telephone at the time of interpretation on 11/27/2022 at 5:48 pm  to provider Eugene Garnet , who verbally acknowledged these results. Electronically Signed   By: Sharlet Salina M.D.   On: 11/27/2022 17:53    Pending Labs Unresulted Labs (From admission, onward)     Start     Ordered   11/28/22 0500  Heparin level (unfractionated)  Tomorrow morning,   R        11/27/22 2127   11/28/22 0500  CBC  Daily,   R      11/27/22 2127            Vitals/Pain Today's Vitals   11/27/22 1914 11/27/22 1915 11/27/22 2059  BP: (!) 198/85  (!) 176/78  Pulse: 93  73  Resp: 18  19  Temp: 97.7 F (36.5 C)    TempSrc: Oral    SpO2: 99%  98%  Weight:  77 kg   Height:  5\' 3"  (1.6 m)   PainSc:  0-No pain     Isolation Precautions No active isolations  Medications Medications  heparin ADULT infusion 100 units/mL (25000 units/276mL) (1,000 Units/hr Intravenous New Bag/Given 11/27/22 2116)  metoprolol succinate (TOPROL-XL) 24 hr tablet 50 mg (has no administration in time range)  pravastatin (PRAVACHOL) tablet 40 mg (has no administration in time range)  irbesartan (AVAPRO) tablet 75 mg (has no administration in time range)  heparin bolus via infusion 2,000 Units (2,000 Units Intravenous Bolus from Bag 11/27/22 2120)    Mobility walks     Focused Assessments    R Recommendations: See Admitting Provider Note  Report given to:   Additional Notes:

## 2022-11-27 NOTE — Assessment & Plan Note (Signed)
-  recently dx early April -follows Dr. Pricilla Holm GYN/ONC  -had planned robotic assisted hysterectomy, bilateral salpingo-oophorectomy, possible tumor debulking, and possible laparotomy in May but now with acute PE

## 2022-11-27 NOTE — Assessment & Plan Note (Signed)
-  outpatient pre-op CT chest/abdomen/pelvis today revealing for right sided pulmonary emboli extending from right main pulmonary artery into the right upper and lower lobe segmental and subsegmental branches. No evidence of right heart strain.  Known mass in uterine fundus. No evidence of intrathoracic, intra-abdominal or intrapelvic metastasis.  -likely secondary to thrombotic state from malignancy -no heart strain, troponin at 11. Hemodynamically stable -continue on IV heparin with goal of eventually transition to oral anticoagulation -obtain bilateral venous doppler ultrasound  -initially had surgery planned in May which will now need to be postpone. Advised her to follow up with GYN/ONC following discharge.

## 2022-11-27 NOTE — H&P (Signed)
History and Physical    Patient: Cristina Barton ZOX:[id number] DOB: 1935/10/19 DOA: 11/27/2022 DOS: the patient was seen and examined on 11/27/2022 PCP: Ailene Ravel, MD  Patient coming from: Home  Chief Complaint:  Chief Complaint  Patient presents with   Shortness of Breath   HPI: Cristina Barton is a 87 y.o. 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   Patient is being followed by Dr. Pricilla Holm GYN/ONC for recent postmenopausal bleeding and D/C hysteroscope results concerning for endometrial stromal sarcoma. They had discussed proceeding with robotic assisted hysterectomy, bilateral salpingo-oophorectomy, possible tumor debulking, and possible laparotomy in May. She was sent for pre-op CT scan today to rule out metastatic disease.   CT chest/abdomen/pelvis today revealing for right sided pulmonary emboli extending from right main pulmonary artery into the right upper and lower lobe segmental and subsegmental branches. No evidence of right heart strain.  Known mass in uterine fundus. No evidence of intrathoracic, intra-abdominal or intrapelvic metastasis.   She denies any shortness of breath, chest pain or LE pain or edema. No personal hx of clots. No tobacco or hormonal therapy. No recent travels.   In the ED, she is hypertensive to SBP 190S and not hypoxic. Troponin reassuring at 11. EKG on my review in SR.  CBC and BMP are unremarkable.   Review of Systems: As mentioned in the history of present illness. All other systems reviewed and are negative. Past Medical History:  Diagnosis Date   Anemia    Arthritis    knees, back   Hyperlipidemia    Hypertension    Memory loss    PMB (postmenopausal bleeding)    Psoriasis    dermatologist---   dr Coralie Carpen;    methotrexate on Mondays   Wears glasses    Past Surgical History:  Procedure Laterality Date   COLONOSCOPY     DILATION AND  CURETTAGE OF UTERUS  11/12/2001    by dr Greta Doom   HYSTEROSCOPY WITH D & C N/A 11/08/2022   Procedure: DILATATION AND CURETTAGE /HYSTEROSCOPY/MYOSURE;  Surgeon: Jerene Bears, MD;  [facility]  Service: Gynecology;  Laterality: N/A;   KNEE ARTHROPLASTY  09/08/2012   Procedure: COMPUTER ASSISTED TOTAL KNEE ARTHROPLASTY;  Surgeon: Eldred Manges, MD;  Location: [facility];  Service: Orthopedics;  Laterality: Left;  Left total knee arthroplasty, cemented   KNEE ARTHROSCOPY Left 03/25/2001    by dr Demetrius Charity. carter   LUMBAR LAMINECTOMY  03/12/2012   Procedure: MICRODISCECTOMY LUMBAR LAMINECTOMY;  Surgeon: Eldred Manges, MD;  Location: [facility];  Service: Orthopedics;  Laterality: N/A;  L3-4, L4-5 Decompression   OPERATIVE ULTRASOUND N/A 11/08/2022   Procedure: OPERATIVE ULTRASOUND;  Surgeon: Jerene Bears, MD;  Location: [facility];  Service: Gynecology;  Laterality: N/A;   TONSILLECTOMY     child   Social History:  reports that she has never smoked. She has never used smokeless tobacco. She reports that she does not drink alcohol and does not use drugs.  Allergies  Allergen Reactions   Sulfa Antibiotics Rash    Family History  Problem Relation Age of Onset   Breast cancer Mother    Breast cancer Sister    Colon cancer Neg Hx    Ovarian cancer Neg Hx    Endometrial cancer Neg Hx    Pancreatic cancer Neg Hx    Prostate cancer Neg Hx  Prior to Admission medications   Medication Sig Start Date End Date Taking? Authorizing Provider  aspirin EC 81 MG tablet Take 81 mg by mouth daily. Swallow whole.    [provider]  betamethasone dipropionate 0.05 % cream APPLY TO AFFECTED AREAS 1-2 TIMES DAILY AS NEEDED. DONT APPLY TO FACE/UNDERARMS/GENITAL SKIN. 10/26/22   [provider]  calcium carbonate (OSCAL) 1500 (600 Ca) MG TABS tablet Take 1,500 mg by mouth 2 (two) times daily with a meal. Unsure of dosage    [provider]  ferrous  sulfate (FEROSUL) 325 (65 FE) MG tablet Take 325 mg by mouth daily with breakfast.    [provider]  folic acid (FOLVITE) 1 MG tablet Take 1 mg by mouth daily. Unsure of dosage    [provider]  HYDROcodone-acetaminophen (NORCO/VICODIN) 5-325 MG tablet Take 1 tablet by mouth every 6 (six) hours as needed for moderate pain. 11/08/22   Jerene Bears, MD  methotrexate (RHEUMATREX) 2.5 MG tablet Take 5 mg by mouth once a week. Mondays. Caution:Chemotherapy. Protect from light. /   per pt takes on monday's    [provider]  metoprolol succinate (TOPROL-XL) 50 MG 24 hr tablet Take 50 mg by mouth daily. Take with or immediately following a meal.    [provider]  pravastatin (PRAVACHOL) 40 MG tablet Take 40 mg by mouth at bedtime.    [provider]  senna-docusate (SENOKOT-S) 8.6-50 MG tablet Take 2 tablets by mouth at bedtime. For AFTER surgery, do not take if having diarrhea 11/23/22   Warner Mccreedy D, NP  traMADol (ULTRAM) 50 MG tablet Take 1 tablet (50 mg total) by mouth every 6 (six) hours as needed for severe pain. For AFTER surgery only, do not take and drive 1/61/09   Cross, Efraim Kaufmann D, NP  valsartan (DIOVAN) 80 MG tablet Take 80 mg by mouth daily.    [provider]    Physical Exam: Vitals:   11/27/22 1914 11/27/22 1915 11/27/22 2059  BP: (!) 198/85  (!) 176/78  Pulse: 93  73  Resp: 18  19  Temp: 97.7 F (36.5 C)    TempSrc: Oral    SpO2: 99%  98%  Weight:  77 kg   Height:  5\' 3"  (1.6 m)    Constitutional: NAD, calm, comfortable, elderly female.  Stated age laying in bed Eyes: lids and conjunctivae normal ENMT: Mucous membranes are moist.  Neck: normal, supple,  Respiratory: bibasilar crackles. Normal respiratory effort. No accessory muscle use. On ambient air. Cardiovascular: Regular rate and rhythm, no murmurs / rubs / gallops. No extremity edema but left LE appears circumferentially larger than right Abdomen: no  tenderness, no masses palpated.  Bowel sounds positive.  Musculoskeletal: no clubbing / cyanosis. No joint deformity upper and lower extremities. Normal muscle tone.  Skin: no rashes, lesions, ulcers.  Neurologic: CN 2-12 grossly intact.  Psychiatric: Normal judgment and insight. Alert and oriented x 3. Normal mood. Data Reviewed:  See HPI  Assessment and Plan: * Acute pulmonary embolism -outpatient pre-op CT chest/abdomen/pelvis today revealing for right sided pulmonary emboli extending from right main pulmonary artery into the right upper and lower lobe segmental and subsegmental branches. No evidence of right heart strain.  Known mass in uterine fundus. No evidence of intrathoracic, intra-abdominal or intrapelvic metastasis.  -likely secondary to thrombotic state from malignancy -no heart strain, troponin at 11. Hemodynamically stable -continue on IV heparin with goal of eventually transition to oral anticoagulation -obtain  bilateral venous doppler ultrasound  -initially had surgery planned in May which will now need to be postpone. Advised her to follow up with GYN/ONC following discharge.    Endometrial stromal sarcoma -recently dx early April -follows Dr. Pricilla Holm GYN/ONC  -had planned robotic assisted hysterectomy, bilateral salpingo-oophorectomy, possible tumor debulking, and possible laparotomy in May but now with acute PE  HTN (hypertension) -Continue metoprolol and valsartan      Advance Care Planning:   Code Status: Full Code   Consults: none  Family Communication: sister at bedside  Severity of Illness: The appropriate patient status for this patient is OBSERVATION. Observation status is judged to be reasonable and necessary in order to provide the required intensity of service to ensure the patient's safety. The patient's presenting symptoms, physical exam findings, and initial radiographic and laboratory data in the context of their medical condition is felt to  place them at decreased risk for further clinical deterioration. Furthermore, it is anticipated that the patient will be medically stable for discharge from the hospital within 2 midnights of admission.   Author: Anselm Jungling, DO 11/27/2022 10:07 PM  For on call review [email or web address].

## 2022-11-27 NOTE — Telephone Encounter (Addendum)
Per Dr Pricilla Holm fax records and surgical optimization form to PCP office for MTX recommendations   Per Dr Pricilla Holm fax records and surgical optimization form to dermatology office for MTX recommendations

## 2022-11-27 NOTE — Assessment & Plan Note (Signed)
Continue metoprolol and valsartan 

## 2022-11-28 ENCOUNTER — Other Ambulatory Visit: Payer: Self-pay

## 2022-11-28 ENCOUNTER — Other Ambulatory Visit (HOSPITAL_COMMUNITY): Payer: Self-pay

## 2022-11-28 ENCOUNTER — Telehealth: Payer: Self-pay | Admitting: Oncology

## 2022-11-28 ENCOUNTER — Observation Stay (HOSPITAL_BASED_OUTPATIENT_CLINIC_OR_DEPARTMENT_OTHER): Payer: Medicare Other

## 2022-11-28 DIAGNOSIS — Z86711 Personal history of pulmonary embolism: Secondary | ICD-10-CM

## 2022-11-28 DIAGNOSIS — I2699 Other pulmonary embolism without acute cor pulmonale: Secondary | ICD-10-CM | POA: Diagnosis not present

## 2022-11-28 DIAGNOSIS — I82412 Acute embolism and thrombosis of left femoral vein: Secondary | ICD-10-CM | POA: Diagnosis not present

## 2022-11-28 DIAGNOSIS — C541 Malignant neoplasm of endometrium: Secondary | ICD-10-CM | POA: Diagnosis not present

## 2022-11-28 DIAGNOSIS — I2609 Other pulmonary embolism with acute cor pulmonale: Secondary | ICD-10-CM | POA: Diagnosis not present

## 2022-11-28 LAB — CBC
HCT: 39.6 % (ref 36.0–46.0)
Hemoglobin: 12.7 g/dL (ref 12.0–15.0)
MCH: 29.3 pg (ref 26.0–34.0)
MCHC: 32.1 g/dL (ref 30.0–36.0)
MCV: 91.5 fL (ref 80.0–100.0)
Platelets: 161 10*3/uL (ref 150–400)
RBC: 4.33 MIL/uL (ref 3.87–5.11)
RDW: 13.2 % (ref 11.5–15.5)
WBC: 6.1 10*3/uL (ref 4.0–10.5)
nRBC: 0 % (ref 0.0–0.2)

## 2022-11-28 LAB — HEPARIN LEVEL (UNFRACTIONATED): Heparin Unfractionated: 0.73 IU/mL — ABNORMAL HIGH (ref 0.30–0.70)

## 2022-11-28 MED ORDER — RIVAROXABAN 20 MG PO TABS: 20.0000 mg | ORAL_TABLET | Freq: Every day | ORAL | Status: DC

## 2022-11-28 MED ORDER — RIVAROXABAN 15 MG PO TABS
15.0000 mg | ORAL_TABLET | Freq: Two times a day (BID) | ORAL | Status: DC
  Administered 2022-11-28: 15 mg via ORAL
  Filled 2022-11-28: qty 1

## 2022-11-28 MED ORDER — RIVAROXABAN (XARELTO) VTE STARTER PACK (15 & 20 MG)
ORAL_TABLET | ORAL | 0 refills | Status: DC
  Filled 2022-11-28: qty 51, 30d supply, fill #0

## 2022-11-28 NOTE — Consult Note (Signed)
Cristina Barton is an 87 y.o. female.  HPI: She is admitted with a newly diagnosed PE in the setting of an ESS recently found at the time of hsc/D&C.  The PE was an incidental finding on a a chest CT as part of a work up for metastatic disease.  She denies any C/P, SOB, cough.  Past Medical History:  Diagnosis Date   Anemia    Arthritis    knees, back   Hyperlipidemia    Hypertension    Memory loss    PMB (postmenopausal bleeding)    Psoriasis    dermatologist---   dr Coralie Carpen;    methotrexate on Mondays   Wears glasses     Past Surgical History:  Procedure Laterality Date   COLONOSCOPY     DILATION AND CURETTAGE OF UTERUS  11/12/2001    by dr Greta Doom   HYSTEROSCOPY WITH D & C N/A 11/08/2022   Procedure: DILATATION AND CURETTAGE /HYSTEROSCOPY/MYOSURE;  Surgeon: Jerene Bears, MD;  Location: [facility];  Service: Gynecology;  Laterality: N/A;   KNEE ARTHROPLASTY  09/08/2012   Procedure: COMPUTER ASSISTED TOTAL KNEE ARTHROPLASTY;  Surgeon: Eldred Manges, MD;  Location: [facility];  Service: Orthopedics;  Laterality: Left;  Left total knee arthroplasty, cemented   KNEE ARTHROSCOPY Left 03/25/2001    by dr Demetrius Charity. carter   LUMBAR LAMINECTOMY  03/12/2012   Procedure: MICRODISCECTOMY LUMBAR LAMINECTOMY;  Surgeon: Eldred Manges, MD;  Location: [facility];  Service: Orthopedics;  Laterality: N/A;  L3-4, L4-5 Decompression   OPERATIVE ULTRASOUND N/A 11/08/2022   Procedure: OPERATIVE ULTRASOUND;  Surgeon: Jerene Bears, MD;  Location: [facility];  Service: Gynecology;  Laterality: N/A;   TONSILLECTOMY     child    Family History  Problem Relation Age of Onset   Breast cancer Mother    Breast cancer Sister    Colon cancer Neg Hx    Ovarian cancer Neg Hx    Endometrial cancer Neg Hx    Pancreatic cancer Neg Hx    Prostate cancer Neg Hx     Social History:  reports that she has never smoked. She has never used smokeless tobacco. She reports that she does not drink  alcohol and does not use drugs.  Allergies:  Allergies  Allergen Reactions   Sulfa Antibiotics Rash    Medications: I have reviewed the patient's current medications.  Results for orders placed or performed during the hospital encounter of 11/27/22 (from the past 48 hour(s))  CBC with Differential/Platelet     Status: None   Collection Time: 11/27/22  8:12 PM  Result Value Ref Range   WBC 6.6 4.0 - 10.5 K/uL   RBC 4.42 3.87 - 5.11 MIL/uL   Hemoglobin 13.2 12.0 - 15.0 g/dL   HCT 16.1 09.6 - 04.5 %   MCV 91.9 80.0 - 100.0 fL   MCH 29.9 26.0 - 34.0 pg   MCHC 32.5 30.0 - 36.0 g/dL   RDW 40.9 81.1 - 91.4 %   Platelets 166 150 - 400 K/uL   nRBC 0.0 0.0 - 0.2 %   Neutrophils Relative % 61 %   Neutro Abs 4.1 1.7 - 7.7 K/uL   Lymphocytes Relative 29 %   Lymphs Abs 1.9 0.7 - 4.0 K/uL   Monocytes Relative 7 %   Monocytes Absolute 0.5 0.1 - 1.0 K/uL   Eosinophils Relative 2 %   Eosinophils Absolute 0.1 0.0 - 0.5 K/uL   Basophils  Relative 1 %   Basophils Absolute 0.0 0.0 - 0.1 K/uL   Immature Granulocytes 0 %   Abs Immature Granulocytes 0.01 0.00 - 0.07 K/uL    Comment: Performed at [facility], [address] [place], [place] [address]  Basic metabolic panel     Status: Abnormal   Collection Time: 11/27/22  8:12 PM  Result Value Ref Range   Sodium 135 135 - 145 mmol/L   Potassium 3.7 3.5 - 5.1 mmol/L   Chloride 102 98 - 111 mmol/L   CO2 26 22 - 32 mmol/L   Glucose, Bld 119 (H) 70 - 99 mg/dL    Comment: Glucose reference range applies only to samples taken after fasting for at least 8 hours.   BUN 9 8 - 23 mg/dL   Creatinine, Ser 5.28 0.44 - 1.00 mg/dL   Calcium 9.8 8.9 - 41.3 mg/dL   GFR, Estimated >24 >40 mL/min    Comment: (NOTE) Calculated using the CKD-EPI Creatinine Equation (2021)    Anion gap 7 5 - 15    Comment: Performed at [facility], [address] [place], [place] [address]  Troponin I (High Sensitivity)     Status:  None   Collection Time: 11/27/22  8:12 PM  Result Value Ref Range   Troponin I (High Sensitivity) 11 <18 ng/L    Comment: (NOTE) Elevated high sensitivity troponin I (hsTnI) values and significant  changes across serial measurements may suggest ACS but many other  chronic and acute conditions are known to elevate hsTnI results.  Refer to the "Links" section for chest pain algorithms and additional  guidance. Performed at [facility], [address], [place], [place] [address]   Protime-INR     Status: None   Collection Time: 11/27/22  8:12 PM  Result Value Ref Range   Prothrombin Time 14.1 11.4 - 15.2 seconds   INR 1.1 0.8 - 1.2    Comment: (NOTE) INR goal varies based on device and disease states. Performed at [facility], [address] [place], [place] [address]   APTT     Status: None   Collection Time: 11/27/22  8:12 PM  Result Value Ref Range   aPTT 29 24 - 36 seconds    Comment: Performed at [facility], [address], [place], [place] [address]  Heparin level (unfractionated)     Status: Abnormal   Collection Time: 11/28/22  5:28 AM  Result Value Ref Range   Heparin Unfractionated 0.73 (H) 0.30 - 0.70 IU/mL    Comment: (NOTE) The clinical reportable range upper limit is being lowered to >1.10 to align with the FDA approved guidance for the current laboratory assay.  If heparin results are below expected values, and patient dosage has  been confirmed, suggest follow up testing of antithrombin III levels. Performed at [facility], [address], [place], [place] [address]   CBC     Status: None   Collection Time: 11/28/22  5:28 AM  Result Value Ref Range   WBC 6.1 4.0 - 10.5 K/uL   RBC 4.33 3.87 - 5.11 MIL/uL   Hemoglobin 12.7 12.0 - 15.0 g/dL   HCT 64.3 32.9 - 51.8 %   MCV 91.5 80.0 - 100.0 fL   MCH 29.3 26.0 - 34.0 pg   MCHC 32.1 30.0 - 36.0 g/dL   RDW 84.1 66.0 - 63.0 %    Platelets 161 150 - 400 K/uL  nRBC 0.0 0.0 - 0.2 %    Comment: Performed at [facility], [address], [place], [place] [address]    CT CHEST ABDOMEN PELVIS W CONTRAST  Result Date: 11/27/2022 CLINICAL DATA:  Recent diagnosis of endometrial stromal sarcoma, staging evaluation EXAM: CT CHEST, ABDOMEN, AND PELVIS WITH CONTRAST TECHNIQUE: Multidetector CT imaging of the chest, abdomen and pelvis was performed following the standard protocol during bolus administration of intravenous contrast. RADIATION DOSE REDUCTION: This exam was performed according to the departmental dose-optimization program which includes automated exposure control, adjustment of the mA and/or kV according to patient size and/or use of iterative reconstruction technique. CONTRAST:  OMNIPAQUE IOHEXOL 300 MG/ML  SOLN COMPARISON:  10/02/2022 FINDINGS: CT CHEST FINDINGS Cardiovascular: While not optimized for the opacification of the pulmonary vasculature, incidental right-sided pulmonary emboli are identified. Pulmonary emboli extend from the right main pulmonary artery into the right upper and lower lobe segmental and subsegmental branches. Mild to moderate clot burden with no evidence of right heart strain. The heart is otherwise unremarkable without pericardial effusion. No evidence of thoracic aortic aneurysm or dissection. Atherosclerosis of the aorta and coronary vasculature. Mediastinum/Nodes: No pathologic mediastinal, hilar, or axillary adenopathy. Trachea and esophagus are unremarkable. Lungs/Pleura: No acute airspace disease, effusion, or pneumothorax. Central airways are patent. Musculoskeletal: There are no acute or destructive bony lesions. Reconstructed images demonstrate no additional findings. CT ABDOMEN PELVIS FINDINGS Hepatobiliary: 2.1 cm simple cyst within the left lobe liver. No other focal liver abnormalities. No biliary duct dilation. Gallbladder is decompressed, with no evidence of  cholelithiasis or cholecystitis. Pancreas: Unremarkable. No pancreatic ductal dilatation or surrounding inflammatory changes. Spleen: Normal in size without focal abnormality. Adrenals/Urinary Tract: Simple appearing 1.9 cm cyst within the mid right kidney does not require specific imaging follow-up. Otherwise the kidneys enhance normally. No urinary tract calculi or obstructive uropathy. The adrenals and bladder are grossly unremarkable. Stomach/Bowel: No bowel obstruction or ileus. Normal appendix right lower quadrant. Diffuse colonic diverticulosis without evidence of acute diverticulitis. No bowel wall thickening or inflammatory change. Vascular/Lymphatic: Atherosclerosis of the abdominal aorta and its branches. This is most pronounced within the bilateral renal arteries, with at least moderate ostial stenosis suspected bilaterally. No pathologic adenopathy within the abdomen or pelvis. Reproductive: Heterogeneous 5.4 x 7.6 x 5.8 cm mass within the uterine fundus, may reflect a combination of uterine fibroids and the endometrial stromal sarcoma referenced in the clinical history. There is no evidence of extra uterine extension. There are no adnexal masses. Other: No free fluid or free intraperitoneal gas. No abdominal wall hernia. Musculoskeletal: No acute or destructive bony lesions. Reconstructed images demonstrate no additional findings. IMPRESSION: 1. Incidental right-sided central and segmental pulmonary emboli. No evidence of right heart strain. 2. Heterogeneous mass within the uterine fundus, likely a combination of uterine fibroids and the endometrial stromal sarcoma reference in the clinical history. No evidence of extra uterine extension of disease. 3. No evidence of intrathoracic, intra-abdominal, or intrapelvic metastases. 4. Colonic diverticulosis without acute diverticulitis. 5. Aortic atherosclerosis. At least moderate ostial stenosis of the bilateral renal arteries. If further evaluation is  clinically indicated, CT angiography could be performed. Critical Value/emergent results were called by telephone at the time of interpretation on 11/27/2022 at 5:48 pm to provider Eugene Garnet , who verbally acknowledged these results. Electronically Signed   By: Sharlet Salina M.D.   On: 11/27/2022 17:53    Review of Systems  Constitutional:  Negative for fatigue and fever.  Respiratory:  Negative  for cough and shortness of breath.   Cardiovascular:  Negative for chest pain and leg swelling.   Blood pressure (!) 175/86, pulse 64, temperature 98.2 F (36.8 C), temperature source Oral, resp. rate 15, height 5\' 3"  (1.6 m), weight 77 kg, SpO2 98 %. Physical Exam Constitutional:      General: She is not in acute distress.    Appearance: She is obese.  Cardiovascular:     Rate and Rhythm: Normal rate and regular rhythm.  Pulmonary:     Effort: Pulmonary effort is normal.     Breath sounds: Normal breath sounds.  Neurological:     Mental Status: She is alert.     Assessment/Plan: Silent PE w/large clot burden in the absence of right heart strain, secondary to a recently diagnosed ESS.  >will likely need to postpone surgery for 3 mos--at significant risk of recurrent VTE in the short term >will consult w/HEME-ONC for consideration of treatment options in the interim >will follow with you  Antionette Char 11/28/2022, 9:13 AM

## 2022-11-28 NOTE — Progress Notes (Signed)
ANTICOAGULATION CONSULT NOTE   Pharmacy Consult for Heparin IV Indication: pulmonary embolus  Allergies  Allergen Reactions   Sulfa Antibiotics Rash    Patient Measurements: Height:  (160 cm) Weight: 77 kg (169 lb 12.1 oz) IBW/kg (Calculated) : 52.4 Heparin Dosing Weight: 69 kg  Vital Signs: Temp: 98.2 F (36.8 C) (04/24 0451) Temp Source: Oral (04/24 0451) BP: 175/86 (04/24 0451) Pulse Rate: 64 (04/24 0451)  Labs: Recent Labs    11/27/22 2012 11/28/22 0528  HGB 13.2 12.7  HCT 40.6 39.6  PLT 166 161  APTT 29  --   LABPROT 14.1  --   INR 1.1  --   HEPARINUNFRC  --  0.73*  CREATININE 0.93  --   TROPONINIHS 11  --      Estimated Creatinine Clearance: 43.4 mL/min (by C-G formula based on SCr of 0.93 mg/dL).   Medical History: Past Medical History:  Diagnosis Date   Anemia    Arthritis    knees, back   Hyperlipidemia    Hypertension    Memory loss    PMB (postmenopausal bleeding)    Psoriasis    dermatologist---   dr Coralie Carpen;    methotrexate on Mondays   Wears glasses     Medications:  No anticoagulation PTA  Assessment: Patient with recent diagnosis of a stromal endometrial tumor with plans of removal and was having the typical scans today. Patient has no specific complaints. However on CT today she was found to have a right sided central and segmental pulmonary embolism with no evidence of right heart strain. Patient at this time takes no anticoagulation.   Goal of Therapy:  Heparin level 0.3-0.7 units/ml Monitor platelets by anticoagulation protocol: Yes  Today, 11/28/22 - Hgb: 12.7 g/dL - ION:629 K/uL - HL is 0.73 slightly supratherapeutic  - No line or bleeding issues per RN    Plan:  - Decrease heparin infusion to 900 units/hr - Check Heparin Level in 8 hours and daily while on heparin - Daily CBC  - Continue to monitor H&H and platelets - Monitor Closely for signs/symptoms of bleeding   Adalberto Cole, PharmD, BCPS 11/28/2022  6:52 AM

## 2022-11-28 NOTE — Telephone Encounter (Signed)
Left a message for [name] (sister) with appointment to see Dr. Bertis Ruddy tomorrow at 1:00 with 12:30 arrival.  Advised if Shweta is still hospitalized tomorrow we will call to reschedule the appointment.

## 2022-11-28 NOTE — Progress Notes (Signed)
  Transition of Care [facility]) Screening Note   Patient Details  Name: LELAINA OATIS Date of Birth: Jul 07, 1936   [facility]) CM/SW Contact:    Otelia Santee, LCSW Phone Number: 11/28/2022, 9:15 AM    Transition of Care Department [facility]) has reviewed patient and no TOC needs have been identified at this time. We will continue to monitor patient advancement through interdisciplinary progression rounds. If new patient transition needs arise, please place a TOC consult.

## 2022-11-28 NOTE — Progress Notes (Signed)
Mobility Specialist - Progress Note   11/28/22 1134  Mobility  Activity Ambulated with assistance in hallway  Level of Assistance Standby assist, set-up cues, supervision of patient - no hands on  Assistive Device None  Distance Ambulated (ft) 180 ft  Activity Response Tolerated well  Mobility Referral Yes  $Mobility charge 1 Mobility   Pt received in bed and agreed to mobility, standby for STS and bed mobility. Had no issues throughout session, pt returned to chair with all needs met and family in room.  Marilynne Halsted Mobility Specialist

## 2022-11-28 NOTE — Discharge Summary (Signed)
Physician Discharge Summary  Cristina Barton ZOX:[id number] DOB: August 10, 1935 DOA: 11/27/2022  PCP: Ailene Ravel, MD  Admit date: 11/27/2022 Discharge date: 11/28/2022  Admitted From: Home Disposition: Home  Recommendations for Outpatient Follow-up:  Follow up with PCP in 1-2 weeks Follow-up with medical oncology, Dr. Bertis Ruddy as scheduled on 11/29/2022 at 1 PM Follow-up with GYN oncology, Dr. 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  Patient currently follows with Dr. Pricilla Holm, GYN/oncology with recent findings of endometrial stromal sarcoma.  Patient was being worked up for planned robotic assisted hysterectomy, bilateral salpingo-oophorectomy and possible tumor debulking in May 2024 in which she was having a preop CT scan to rule out metastatic disease.  On CT chest/abdomen/pelvis, reveals right sided pulmonary emboli extending from right main pulmonary artery into the right upper and lower segmental and subsegmental branches without evidence of right heart strain; noted mass in uterine fundus without evidence of intrathoracic, intra-abdominal or intrapelvic metastasis.  Patient with no specific complaints, specifically denies shortness of breath, no chest pain, no lower extremity pain or edema.  Denies any history of PE/DVT in the past, denies tobacco or hormonal therapy  and no recent travel or injury.  In the ED, temperature 97.7 F, HR 93, RR 18, BP 198/85, SpO2 99% on room air.  WBC 6.6, hemoglobin 13.2, platelets 166.  Sodium 135, potassium 3.7, chloride 102, CO2 26, glucose 119, BUN 9, creatinine 0.93.  High sensitive troponin 11.  INR 1.1.  Patient was started on heparin drip by EDP.  TRH consulted for admission for further evaluation management of acute PE found incidentally on outpatient CT staging scan.  Hospital course:  Acute pulmonary embolism Left lower trauma DVT Patient presenting to ED after incidental CT chest/abdomen/pelvis staging scan outpatient with finding of right sided central/segmental pulmonary emboli without evidence of heart strain.  Patient was initially started on heparin drip.  Patient was not tachycardic, oxygenating well on room air.  Vascular duplex ultrasound lower extremity positive for DVT left femoral, left popliteal, left peroneal, left gastrocnemius veins.  Heparin drip was transition to Xarelto.  Patient will have follow-up with medical oncology, Dr. Bertis Ruddy on 11/29/2022 at 1 PM.  Essential hypertension Continue valsartan 80 mg p.o. daily.  Psoriasis On methotrexate outpatient, 5 mg p.o. once weekly.  Follows with dermatology, Dr.Akkurt outpatient.  May need to consider stopping this medication while on DOAC due to possible interaction.  Endometrial stromal sarcoma Follows with GYN/oncology outpatient, Dr. Pricilla Holm.  Recently diagnosed April 2024.  Plan for robotic assisted hysterectomy, bilateral salpingo-oophorectomy and possible tumor debulking with possible laparotomy in May 2024 but likely will need to postpone surgery for 3 months given PE/DVT.  Continue outpatient follow-up with GYN/oncology.  Discharge Diagnoses:  Principal Problem:   Acute pulmonary embolism Active Problems:   HTN (hypertension)   Endometrial stromal sarcoma    Discharge Instructions  Discharge Instructions     Call MD for:  difficulty  breathing, headache or visual disturbances   Complete  by: As directed    Call MD for:  extreme fatigue   Complete by: As directed    Call MD for:  persistant dizziness or light-headedness   Complete by: As directed    Call MD for:  persistant nausea and vomiting   Complete by: As directed    Call MD for:  severe uncontrolled pain   Complete by: As directed    Call MD for:  temperature >100.4   Complete by: As directed    Diet - low sodium heart healthy   Complete by: As directed    Increase activity slowly   Complete by: As directed       Allergies as of 11/28/2022       Reactions   Sulfa Antibiotics Rash        Medication List     TAKE these medications    aspirin EC 81 MG tablet Take 81 mg by mouth daily. Swallow whole.   betamethasone dipropionate 0.05 % cream Apply 1-2 Applications topically daily as needed.   calcium carbonate 1500 (600 Ca) MG Tabs tablet Commonly known as: OSCAL Take 1,500 mg by mouth 2 (two) times daily with a meal. Unsure of dosage   FeroSul 325 (65 FE) MG tablet Generic drug: ferrous sulfate Take 325 mg by mouth daily with breakfast.   folic acid 1 MG tablet Commonly known as: FOLVITE Take 1 mg by mouth daily. Unsure of dosage   HYDROcodone-acetaminophen 5-325 MG tablet Commonly known as: NORCO/VICODIN Take 1 tablet by mouth every 6 (six) hours as needed for moderate pain.   methotrexate 2.5 MG tablet Commonly known as: RHEUMATREX Take 5 mg by mouth once a week. Mondays. Caution:Chemotherapy. Protect from light. /   per pt takes on monday's   metoprolol succinate 50 MG 24 hr tablet Commonly known as: TOPROL-XL Take 50 mg by mouth daily. Take with or immediately following a meal.   pravastatin 40 MG tablet Commonly known as: PRAVACHOL Take 40 mg by mouth at bedtime.   senna-docusate 8.6-50 MG tablet Commonly known as: Senokot-S Take 2 tablets by mouth at bedtime. For AFTER surgery, do not take if having diarrhea   traMADol  50 MG tablet Commonly known as: ULTRAM Take 1 tablet (50 mg total) by mouth every 6 (six) hours as needed for severe pain. For AFTER surgery only, do not take and drive   valsartan 80 MG tablet Commonly known as: DIOVAN Take 80 mg by mouth daily.   Xarelto Starter Pack Generic drug: Rivaroxaban Stater Pack (15 mg and 20 mg) Follow package directions: Take one  tablet by mouth twice a day. On day 22, switch to one  tablet once a day. Take with food.        Follow-up Information     Hamrick, Durward Fortes, MD. Schedule an appointment as soon as possible for a visit in 1 week(s).   Specialty: Family Medicine Contact information: [address] [place] [address] [phone]         Artis Delay, MD. Go on 11/29/2022.   Specialty: Hematology and Oncology Why: 1230pm Contact information: [address] [place] [address] [phone]                Allergies  Allergen Reactions   Sulfa Antibiotics Rash    Consultations: Medical oncology GYN oncology   Procedures/Studies: VAS [place] LOWER EXTREMITY VENOUS (DVT)  Result Date: 11/28/2022  Lower Venous DVT Study Patient Name:  [name]  Date  of Exam:   11/28/2022 Medical Rec #: [id number]      Accession #:    [id number] Date of Birth: [birth year]       Patient Gender: F Patient Age:   57 years Exam Location:  [facility] Procedure:      VAS [place] LOWER EXTREMITY VENOUS (DVT) Referring Phys: CHING TU --------------------------------------------------------------------------------  Indications: Pulmonary embolism.  Risk Factors: Recent cancer diagnosis. Comparison Study: No previous exams Performing Technologist: Jody Hill RVT, RDMS  Examination Guidelines: A complete evaluation includes B-mode imaging, spectral Doppler, color Doppler, and power Doppler as needed of all accessible portions of each vessel. Bilateral testing is considered an integral part of a complete examination. Limited  examinations for reoccurring indications may be performed as noted. The reflux portion of the exam is performed with the patient in reverse Trendelenburg.  +---------+---------------+---------+-----------+----------+--------------+ RIGHT    CompressibilityPhasicitySpontaneityPropertiesThrombus Aging +---------+---------------+---------+-----------+----------+--------------+ CFV      Full           Yes      Yes                                 +---------+---------------+---------+-----------+----------+--------------+ SFJ      Full                                                        +---------+---------------+---------+-----------+----------+--------------+ FV Prox  Full           Yes      Yes                                 +---------+---------------+---------+-----------+----------+--------------+ FV Mid   Full           Yes      Yes                                 +---------+---------------+---------+-----------+----------+--------------+ FV DistalFull           Yes      Yes                                 +---------+---------------+---------+-----------+----------+--------------+ PFV      Full                                                        +---------+---------------+---------+-----------+----------+--------------+ POP      Full           Yes      Yes                                 +---------+---------------+---------+-----------+----------+--------------+ PTV      Full                                                        +---------+---------------+---------+-----------+----------+--------------+  PERO     Full                                                        +---------+---------------+---------+-----------+----------+--------------+   +---------+---------------+---------+-----------+----------+----------------+ LEFT     CompressibilityPhasicitySpontaneityPropertiesThrombus Aging    +---------+---------------+---------+-----------+----------+----------------+ CFV      Full           Yes      Yes                                   +---------+---------------+---------+-----------+----------+----------------+ SFJ      Full                                                          +---------+---------------+---------+-----------+----------+----------------+ FV Prox  Full           Yes      Yes                                   +---------+---------------+---------+-----------+----------+----------------+ FV Mid   None           No       No                   Acute            +---------+---------------+---------+-----------+----------+----------------+ FV DistalPartial        Yes      Yes                  Acute            +---------+---------------+---------+-----------+----------+----------------+ PFV      Full                                                          +---------+---------------+---------+-----------+----------+----------------+ POP      None           No       No                   Acute            +---------+---------------+---------+-----------+----------+----------------+ PTV      Full                                                          +---------+---------------+---------+-----------+----------+----------------+ PERO     None           No       No                                    +---------+---------------+---------+-----------+----------+----------------+  Gastroc  Partial        No       No                   Acute - mid calf +---------+---------------+---------+-----------+----------+----------------+    Summary: BILATERAL: -No evidence of popliteal cyst, bilaterally. RIGHT: - There is no evidence of deep vein thrombosis in the lower extremity.  LEFT: - Findings consistent with acute deep vein thrombosis involving the left femoral vein, left popliteal vein, left peroneal veins, and left gastrocnemius veins.   *See table(s) above for measurements and observations.    Preliminary    CT CHEST ABDOMEN PELVIS W CONTRAST  Result Date: 11/27/2022 CLINICAL DATA:  Recent diagnosis of endometrial stromal sarcoma, staging evaluation EXAM: CT CHEST, ABDOMEN, AND PELVIS WITH CONTRAST TECHNIQUE: Multidetector CT imaging of the chest, abdomen and pelvis was performed following the standard protocol during bolus administration of intravenous contrast. RADIATION DOSE REDUCTION: This exam was performed according to the departmental dose-optimization program which includes automated exposure control, adjustment of the mA and/or kV according to patient size and/or use of iterative reconstruction technique. CONTRAST:  OMNIPAQUE IOHEXOL 300 MG/ML  SOLN COMPARISON:  10/02/2022 FINDINGS: CT CHEST FINDINGS Cardiovascular: While not optimized for the opacification of the pulmonary vasculature, incidental right-sided pulmonary emboli are identified. Pulmonary emboli extend from the right main pulmonary artery into the right upper and lower lobe segmental and subsegmental branches. Mild to moderate clot burden with no evidence of right heart strain. The heart is otherwise unremarkable without pericardial effusion. No evidence of thoracic aortic aneurysm or dissection. Atherosclerosis of the aorta and coronary vasculature. Mediastinum/Nodes: No pathologic mediastinal, hilar, or axillary adenopathy. Trachea and esophagus are unremarkable. Lungs/Pleura: No acute airspace disease, effusion, or pneumothorax. Central airways are patent. Musculoskeletal: There are no acute or destructive bony lesions. Reconstructed images demonstrate no additional findings. CT ABDOMEN PELVIS FINDINGS Hepatobiliary: 2.1 cm simple cyst within the left lobe liver. No other focal liver abnormalities. No biliary duct dilation. Gallbladder is decompressed, with no evidence of cholelithiasis or cholecystitis. Pancreas: Unremarkable. No pancreatic ductal dilatation or  surrounding inflammatory changes. Spleen: Normal in size without focal abnormality. Adrenals/Urinary Tract: Simple appearing 1.9 cm cyst within the mid right kidney does not require specific imaging follow-up. Otherwise the kidneys enhance normally. No urinary tract calculi or obstructive uropathy. The adrenals and bladder are grossly unremarkable. Stomach/Bowel: No bowel obstruction or ileus. Normal appendix right lower quadrant. Diffuse colonic diverticulosis without evidence of acute diverticulitis. No bowel wall thickening or inflammatory change. Vascular/Lymphatic: Atherosclerosis of the abdominal aorta and its branches. This is most pronounced within the bilateral renal arteries, with at least moderate ostial stenosis suspected bilaterally. No pathologic adenopathy within the abdomen or pelvis. Reproductive: Heterogeneous 5.4 x 7.6 x 5.8 cm mass within the uterine fundus, may reflect a combination of uterine fibroids and the endometrial stromal sarcoma referenced in the clinical history. There is no evidence of extra uterine extension. There are no adnexal masses. Other: No free fluid or free intraperitoneal gas. No abdominal wall hernia. Musculoskeletal: No acute or destructive bony lesions. Reconstructed images demonstrate no additional findings. IMPRESSION: 1. Incidental right-sided central and segmental pulmonary emboli. No evidence of right heart strain. 2. Heterogeneous mass within the uterine fundus, likely a combination of uterine fibroids and the endometrial stromal sarcoma reference in the clinical history. No evidence of extra uterine extension of disease. 3. No evidence of intrathoracic, intra-abdominal, or intrapelvic metastases. 4. Colonic diverticulosis without acute diverticulitis. 5. Aortic atherosclerosis.  At least moderate ostial stenosis of the bilateral renal arteries. If further evaluation is clinically indicated, CT angiography could be performed. Critical Value/emergent results were  called by telephone at the time of interpretation on 11/27/2022 at 5:48 pm to provider Eugene Garnet , who verbally acknowledged these results. Electronically Signed   By: Sharlet Salina M.D.   On: 11/27/2022 17:53   [place] Intraoperative  Result Date: 11/08/2022 CLINICAL DATA:  Ultrasound was provided for use by the ordering physician.  No provider Interpretation or professional fees incurred.      Subjective: Patient seen examined bedside, resting comfortably.  Lying in bed.  Son present.  Updated on left lower extremity DVT as well as went over results of CT scan to include pulmonary embolism.  Transition from heparin drip to Xarelto today.  Patient has follow-up scheduled with medical oncology, Dr. Bertis Ruddy tomorrow.  Seen by GYN/oncology today.  Discharging home.  Patient with no other complaints, concerns or questions at this time.  Denies headache, no dizziness, no visual changes, no chest pain, no palpitations, no shortness of breath, no abdominal pain, no focal weakness, no fever/chills/night sweats, no nausea/vomiting/diarrhea, no fatigue, no paresthesias.  No acute events overnight per nursing staff.  Discharge Exam: Vitals:   11/28/22 0224 11/28/22 0451  BP: (!) 167/86 (!) 175/86  Pulse: 60 64  Resp: 19 15  Temp: 98.6 F (37 C) 98.2 F (36.8 C)  SpO2: 98% 98%   Vitals:   11/27/22 2059 11/27/22 2232 11/28/22 0224 11/28/22 0451  BP: (!) 176/78 (!) 190/84 (!) 167/86 (!) 175/86  Pulse: 73 67 60 64  Resp: 19 18 19 15   Temp:  98.5 F (36.9 C) 98.6 F (37 C) 98.2 F (36.8 C)  TempSrc:  Oral Oral Oral  SpO2: 98% 100% 98% 98%  Weight:      Height:        Physical Exam: GEN: NAD, alert and oriented x 3, elderly in appearance HEENT: NCAT, PERRL, EOMI, sclera clear, MMM PULM: CTAB w/o wheezes/crackles, normal respiratory effort, on room air CV: RRR w/o M/G/R GI: abd soft, NTND, NABS, no R/G/M MSK: Slight left lower extremity peripheral edema in comparison to right, muscle  strength globally intact 5/5 bilateral upper/lower extremities NEURO: CN II-XII intact, no focal deficits, sensation to light touch intact PSYCH: normal mood/affect Integumentary: dry/intact, no rashes or wounds    The results of significant diagnostics from this hospitalization (including imaging, microbiology, ancillary and laboratory) are listed below for reference.     Microbiology: No results found for this or any previous visit (from the past 240 hour(s)).   Labs: BNP (last 3 results) No results for input(s): "BNP" in the last 8760 hours. Basic Metabolic Panel: Recent Labs  Lab 11/27/22 2012  NA 135  K 3.7  CL 102  CO2 26  GLUCOSE 119*  BUN 9  CREATININE 0.93  CALCIUM 9.8   Liver Function Tests: No results for input(s): "AST", "ALT", "ALKPHOS", "BILITOT", "PROT", "ALBUMIN" in the last 168 hours. No results for input(s): "LIPASE", "AMYLASE" in the last 168 hours. No results for input(s): "AMMONIA" in the last 168 hours. CBC: Recent Labs  Lab 11/27/22 2012 11/28/22 0528  WBC 6.6 6.1  NEUTROABS 4.1  --   HGB 13.2 12.7  HCT 40.6 39.6  MCV 91.9 91.5  PLT 166 161   Cardiac Enzymes: No results for input(s): "CKTOTAL", "CKMB", "CKMBINDEX", "TROPONINI" in the last 168 hours. BNP: Invalid input(s): "POCBNP" CBG: No results for input(s): "GLUCAP" in  the last 168 hours. D-Dimer No results for input(s): "DDIMER" in the last 72 hours. Hgb A1c No results for input(s): "HGBA1C" in the last 72 hours. Lipid Profile No results for input(s): "CHOL", "HDL", "LDLCALC", "TRIG", "CHOLHDL", "LDLDIRECT" in the last 72 hours. Thyroid function studies No results for input(s): "TSH", "T4TOTAL", "T3FREE", "THYROIDAB" in the last 72 hours.  Invalid input(s): "FREET3" Anemia work up No results for input(s): "VITAMINB12", "FOLATE", "FERRITIN", "TIBC", "IRON", "RETICCTPCT" in the last 72 hours. Urinalysis    Component Value Date/Time   COLORURINE YELLOW 09/01/2012 1018    APPEARANCEUR CLEAR 09/01/2012 1018   LABSPEC 1.025 09/01/2012 1018   PHURINE 5.0 09/01/2012 1018   GLUCOSEU NEGATIVE 09/01/2012 1018   HGBUR NEGATIVE 09/01/2012 1018   BILIRUBINUR NEGATIVE 09/01/2012 1018   KETONESUR NEGATIVE 09/01/2012 1018   PROTEINUR NEGATIVE 09/01/2012 1018   UROBILINOGEN 1.0 09/01/2012 1018   NITRITE NEGATIVE 09/01/2012 1018   LEUKOCYTESUR NEGATIVE 09/01/2012 1018   Sepsis Labs Recent Labs  Lab 11/27/22 2012 11/28/22 0528  WBC 6.6 6.1   Microbiology No results found for this or any previous visit (from the past 240 hour(s)).   Time coordinating discharge: Over 30 minutes  SIGNED:   Alvira Philips [place], DO  [facility] 11/28/2022, 12:30 PM

## 2022-11-28 NOTE — Progress Notes (Signed)
Gynecologic Oncology Consultation  [name] 87 y.o. female  CC:  Chief Complaint  Patient presents with   Shortness of Breath   HPI:  Cristina Barton is a 87 y.o. female currently admitted for an incidental finding of PE on preoperative CT imaging. She was initially seen in consultation at the request of Dr. 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   The patient initially presented in late February in the setting of postmenopausal bleeding for 2 weeks.  She denied any associated symptoms.  Ultrasound performed on 2/27 showed a uterus measuring 10.3 x 5.4 x 6.4 cm with multiple uterine leiomyomata, largest measuring 4.9 cm.  Endometrium is obscured by leiomyoma.  Neither ovary visualized.  Endometrial biopsy performed with mostly blood noted, nondiagnostic.  Patient was counseled on options moving forward and was agreeable to hysteroscopy with endometrial sampling.  On 11/08/2022, the patient was taken to the operating room and underwent hysteroscopy with MyoSure resection of a large endometrial polyp appearing mass filling the endometrial cavity.  Anterior fibroid was also noted distorting the endometrium.  Endometrium itself was atrophic in appearance.  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   At her consultation, she noted overall doing well since the procedure.  She had some darker brown discharge but denied any bleeding.  Denied any pelvic or abdominal pain.  She reported baseline bowel bladder function.  She denies any change in appetite or weight recently. She takes weekly methotrexate in the setting of her psoriasis.  She has some memory loss, but she lives on her own.  Both her sister and son live nearby.  The patient was scheduled for surgery including robotic assisted total hysterectomy with bilateral salpingo-oophorectomy, possible tumor debulking, and possible tumor debulking with Dr. Eugene Garnet on May 8.  As her preoperative workup and evaluation for metastatic disease, she underwent a CT chest abdomen and pelvis on November 27, 2022. Results below: 1. Incidental right-sided central and segmental pulmonary emboli. No evidence of right heart strain. 2. Heterogeneous mass within the uterine fundus, likely a combination of uterine fibroids and the endometrial stromal sarcoma reference in the clinical history. No evidence of extra uterine extension of disease. 3. No evidence of intrathoracic, intra-abdominal, or intrapelvic metastases. 4. Colonic diverticulosis without acute diverticulitis. 5. Aortic atherosclerosis. At least moderate ostial stenosis of the bilateral renal arteries. If further evaluation is clinically indicated, CT angiography could be performed. Based on the results, she was advised to seek further evaluation and management in the emergency room due to the incidental finding of a right sided central and segmental pulmonary emboli.  Interval History: Patient is currently resting in bed with her sister at the bedside.  She is currently receiving a venous Doppler.  She states she is ready to go home.  She denies dyspnea or chest pain.  No concerns or new symptoms voiced.  Review of Systems: negative review. Having intermittent vaginal bleeding.  Current Meds: Current inpt meds reviewed  Allergy:  Allergies  Allergen Reactions   Sulfa Antibiotics Rash    Social Hx:   Social History   Socioeconomic History   Marital status: Widowed    Spouse name: Not on file   Number of children: Not on file   Years of education: Not on file   Highest education level: Not on file  Occupational History   Not on file  Tobacco Use   Smoking status: Never   Smokeless  tobacco: Never  Vaping Use   Vaping Use: Never used  Substance and Sexual Activity   Alcohol use: No   Drug use: Never   Sexual activity: Never    Birth control/protection: Post-menopausal  Other Topics  Concern   Not on file  Social History Narrative   Not on file   Social Determinants of Health   Financial Resource Strain: Not on file  Food Insecurity: No Food Insecurity (11/27/2022)   Hunger Vital Sign    Worried About Running Out of Food in the Last Year: Never true    Ran Out of Food in the Last Year: Never true  Transportation Needs: No Transportation Needs (11/27/2022)   PRAPARE - Administrator, Civil Service (Medical): No    Lack of Transportation (Non-Medical): No  Physical Activity: Not on file  Stress: Not on file  Social Connections: Not on file  Intimate Partner Violence: Not At Risk (11/27/2022)   Humiliation, Afraid, Rape, and Kick questionnaire    Fear of Current or Ex-Partner: No    Emotionally Abused: No    Physically Abused: No    Sexually Abused: No    Past Surgical Hx:  Past Surgical History:  Procedure Laterality Date   COLONOSCOPY     DILATION AND CURETTAGE OF UTERUS  11/12/2001    by dr Greta Doom   HYSTEROSCOPY WITH D & C N/A 11/08/2022   Procedure: DILATATION AND CURETTAGE /HYSTEROSCOPY/MYOSURE;  Surgeon: Jerene Bears, MD;  Location: [facility];  Service: Gynecology;  Laterality: N/A;   KNEE ARTHROPLASTY  09/08/2012   Procedure: COMPUTER ASSISTED TOTAL KNEE ARTHROPLASTY;  Surgeon: Eldred Manges, MD;  Location: [facility] OR;  Service: Orthopedics;  Laterality: Left;  Left total knee arthroplasty, cemented   KNEE ARTHROSCOPY Left 03/25/2001    by dr Demetrius Charity. carter   LUMBAR LAMINECTOMY  03/12/2012   Procedure: MICRODISCECTOMY LUMBAR LAMINECTOMY;  Surgeon: Eldred Manges, MD;  Location: [facility] OR;  Service: Orthopedics;  Laterality: N/A;  L3-4, L4-5 Decompression   OPERATIVE ULTRASOUND N/A 11/08/2022   Procedure: OPERATIVE ULTRASOUND;  Surgeon: Jerene Bears, MD;  Location: [facility];  Service: Gynecology;  Laterality: N/A;   TONSILLECTOMY     child    Past Medical Hx:  Past Medical History:  Diagnosis Date   Anemia     Arthritis    knees, back   Hyperlipidemia    Hypertension    Memory loss    PMB (postmenopausal bleeding)    Psoriasis    dermatologist---   dr Coralie Carpen;    methotrexate on Mondays   Wears glasses     Family Hx:  Family History  Problem Relation Age of Onset   Breast cancer Mother    Breast cancer Sister    Colon cancer Neg Hx    Ovarian cancer Neg Hx    Endometrial cancer Neg Hx    Pancreatic cancer Neg Hx    Prostate cancer Neg Hx     Vitals:  Blood pressure (!) 175/86, pulse 64, temperature 98.2 F (36.8 C), temperature source Oral, resp. rate 15, height  (1.6 m), weight 169 lb 12.1 oz (77 kg), SpO2 98 %.  Physical Exam:  Performed by Dr. Tamela Oddi  Assessment/Plan: 87 year old female currently admitted for management of an incidental finding of a pulmonary emboli on preoperative CT imaging to evaluate for metastatic disease.  She has recently been diagnosed with endometrial stromal sarcoma with plans for robotic surgery in  the near future.  Given the new finding of a pulmonary emboli, surgery will need to be postponed for potentially 3 months and will defer timing to Dr. Bertis Ruddy.  Plan will be for consultation outpatient with Dr. Artis Delay, Medical oncologist/hematologist, for discussion of potential neoadjuvant chemotherapy and PE management. This appointment will be arranged and the patient's sister will be contacted.   Doylene Bode, NP 11/28/2022, 7:43 AM

## 2022-11-28 NOTE — Progress Notes (Signed)
BLE venous duplex has been completed.  Preliminary results given to Dr. [place].   Results can be found under chart review under CV PROC. 11/28/2022 10:25 AM Aiman Sonn RVT, RDMS

## 2022-11-28 NOTE — Discharge Instructions (Addendum)
Information on my medicine - XARELTO (rivaroxaban)  WHY WAS XARELTO PRESCRIBED FOR YOU? Xarelto was prescribed to treat blood clots that may have been found in the veins of your legs (deep vein thrombosis) or in your lungs (pulmonary embolism) and to reduce the risk of them occurring again.  What do you need to know about Xarelto? The starting dose is one 15 mg tablet taken TWICE daily with food for the FIRST 21 DAYS then on 12/19/22  the dose is changed to one 20 mg tablet taken ONCE A DAY with your evening meal.  DO NOT stop taking Xarelto without talking to the health care provider who prescribed the medication.  Refill your prescription for 20 mg tablets before you run out.  After discharge, you should have regular check-up appointments with your healthcare provider that is prescribing your Xarelto.  In the future your dose may need to be changed if your kidney function changes by a significant amount.  What do you do if you miss a dose? If you are taking Xarelto TWICE DAILY and you miss a dose, take it as soon as you remember. You may take two 15 mg tablets (total 30 mg) at the same time then resume your regularly scheduled 15 mg twice daily the next day.  If you are taking Xarelto ONCE DAILY and you miss a dose, take it as soon as you remember on the same day then continue your regularly scheduled once daily regimen the next day. Do not take two doses of Xarelto at the same time.   Important Safety Information Xarelto is a blood thinner medicine that can cause bleeding. You should call your healthcare provider right away if you experience any of the following: Bleeding from an injury or your nose that does not stop. Unusual colored urine (red or dark brown) or unusual colored stools (red or black). Unusual bruising for unknown reasons. A serious fall or if you hit your head (even if there is no bleeding).  Some medicines may interact with Xarelto and might increase your risk  of bleeding while on Xarelto. To help avoid this, consult your healthcare provider or pharmacist prior to using any new prescription or non-prescription medications, including herbals, vitamins, non-steroidal anti-inflammatory drugs (NSAIDs) and supplements.  This website has more information on Xarelto: VisitDestination.com.br.

## 2022-11-28 NOTE — Progress Notes (Signed)
ANTICOAGULATION CONSULT NOTE   Pharmacy Consult for Xarelto Indication: pulmonary embolus  Allergies  Allergen Reactions   Sulfa Antibiotics Rash    Patient Measurements: Height:  (160 cm) Weight: 77 kg (169 lb 12.1 oz) IBW/kg (Calculated) : 52.4 Heparin Dosing Weight: 69 kg  Vital Signs: Temp: 98.2 F (36.8 C) (04/24 0451) Temp Source: Oral (04/24 0451) BP: 175/86 (04/24 0451) Pulse Rate: 64 (04/24 0451)  Labs: Recent Labs    11/27/22 2012 11/28/22 0528  HGB 13.2 12.7  HCT 40.6 39.6  PLT 166 161  APTT 29  --   LABPROT 14.1  --   INR 1.1  --   HEPARINUNFRC  --  0.73*  CREATININE 0.93  --   TROPONINIHS 11  --      Estimated Creatinine Clearance: 43.4 mL/min (by C-G formula based on SCr of 0.93 mg/dL).   Medical History: Past Medical History:  Diagnosis Date   Anemia    Arthritis    knees, back   Hyperlipidemia    Hypertension    Memory loss    PMB (postmenopausal bleeding)    Psoriasis    dermatologist---   dr Coralie Carpen;    methotrexate on Mondays   Wears glasses     Medications:  No anticoagulation PTA  Assessment: Patient with recent diagnosis of a stromal endometrial tumor with plans of removal. Patient had outpatient CT to further evaluate and was found to have a right sided central and segmental pulmonary embolism with no evidence of right heart strain. She was directed to the ED and started on heparin infusion.  Pharmacy consulted to transition to Xarelto.    Plan:  - Stop heparin infusion and give first dose of Xarelto at same time - Start Xarelto 15 mg BID x 21 days followed by 20 mg daily - Continue to monitor H&H and platelets - Monitor Closely for signs/symptoms of bleeding - Will counsel patient and provide 30 day coupon prior to discharge  Pricilla Riffle, PharmD, BCPS Clinical Pharmacist 11/28/2022 9:29 AM

## 2022-11-28 NOTE — Patient Instructions (Signed)
Plan to have a CT scan before surgery. For the CT scan, nothing to eat or drink 4 hours before the scan. Arrive 2 hours before your scan to begin drinking contrast for the scan.   We will also reach out to your other physicians/providers to obtain clearance for surgery.  Preparing for your Surgery  Plan for surgery on Dec 12, 2022 with Dr. Katherine Tucker at [facility]. You will be scheduled for robotic assisted total laparoscopic hysterectomy (removal of the uterus and cervix), bilateral salpingo-oophorectomy (removal of both ovaries and fallopian tubes), possible tumor debulking, possible laparotomy (larger incision on your abdomen if needed).    Pre-operative Testing -You will receive a phone call from presurgical testing at [facility] to arrange for a pre-operative appointment and lab work.  -Bring your insurance card, copy of an advanced directive if applicable, medication list  -At that visit, you will be asked to sign a consent for a possible blood transfusion in case a transfusion becomes necessary during surgery.  The need for a blood transfusion is rare but having consent is a necessary part of your care.     -You should not be taking blood thinners or aspirin at least ten days prior to surgery unless instructed by your surgeon.  -Do not take supplements such as fish oil (omega 3), red yeast rice, turmeric before your surgery. You want to avoid medications with aspirin in them including headache powders such as BC or Goody's), Excedrin migraine.  Day Before Surgery at Home -You will be asked to take in a light diet the day before surgery. You will be advised you can have clear liquids up until 3 hours before your surgery.    Eat a light diet the day before surgery.  Examples including soups, broths, toast, yogurt, mashed potatoes.  AVOID GAS PRODUCING FOODS AND BEVERAGES. Things to avoid include carbonated beverages (fizzy beverages, sodas), raw fruits and raw  vegetables (uncooked), or beans.   If your bowels are filled with gas, your surgeon will have difficulty visualizing your pelvic organs which increases your surgical risks.  Your role in recovery Your role is to become active as soon as directed by your doctor, while still giving yourself time to heal.  Rest when you feel tired. You will be asked to do the following in order to speed your recovery:  - Cough and breathe deeply. This helps to clear and expand your lungs and can prevent pneumonia after surgery.  - STAY ACTIVE WHEN YOU GET HOME. Do mild physical activity. Walking or moving your legs help your circulation and body functions return to normal. Do not try to get up or walk alone the first time after surgery.   -If you develop swelling on one leg or the other, pain in the back of your leg, redness/warmth in one of your legs, please call the office or go to the Emergency Room to have a doppler to rule out a blood clot. For shortness of breath, chest pain-seek care in the Emergency Room as soon as possible. - Actively manage your pain. Managing your pain lets you move in comfort. We will ask you to rate your pain on a scale of zero to 10. It is your responsibility to tell your doctor or nurse where and how much you hurt so your pain can be treated.  Special Considerations -If you are diabetic, you may be placed on insulin after surgery to have closer control over your blood sugars to promote   healing and recovery.  This does not mean that you will be discharged on insulin.  If applicable, your oral antidiabetics will be resumed when you are tolerating a solid diet.  -Your final pathology results from surgery should be available around one week after surgery and the results will be relayed to you when available.  -FMLA forms can be faxed to [phone] and please allow 5-7 business days for completion.  Pain Management After Surgery -You have been prescribed your pain medication (tramadol)  and bowel regimen medications before surgery so that you can have these available when you are discharged from the hospital. The pain medication is for use ONLY AFTER surgery and a new prescription will not be given. DO NOT TAKE WITH OTHER PAIN MEDICATIONS LIKE HYDROCODONE/APAP  -Make sure that you have Tylenol IF YOU ARE ABLE TO TAKE THESE MEDICATION at home to use on a regular basis after surgery for pain control.   -Review the attached handout on narcotic use and their risks and side effects.   Bowel Regimen -You have been prescribed Sennakot-S to take nightly to prevent constipation especially if you are taking the narcotic pain medication intermittently.  It is important to prevent constipation and drink adequate amounts of liquids. You can stop taking this medication when you are not taking pain medication and you are back on your normal bowel routine.  Risks of Surgery Risks of surgery are low but include bleeding, infection, damage to surrounding structures, re-operation, blood clots, and very rarely death.   Blood Transfusion Information (For the consent to be signed before surgery)  We will be checking your blood type before surgery so in case of emergencies, we will know what type of blood you would need.                                            WHAT IS A BLOOD TRANSFUSION?  A transfusion is the replacement of blood or some of its parts. Blood is made up of multiple cells which provide different functions. Red blood cells carry oxygen and are used for blood loss replacement. White blood cells fight against infection. Platelets control bleeding. Plasma helps clot blood. Other blood products are available for specialized needs, such as hemophilia or other clotting disorders. BEFORE THE TRANSFUSION  Who gives blood for transfusions?  You may be able to donate blood to be used at a later date on yourself (autologous donation). Relatives can be asked to donate blood. This is  generally not any safer than if you have received blood from a stranger. The same precautions are taken to ensure safety when a relative's blood is donated. Healthy volunteers who are fully evaluated to make sure their blood is safe. This is blood bank blood. Transfusion therapy is the safest it has ever been in the practice of medicine. Before blood is taken from a donor, a complete history is taken to make sure that person has no history of diseases nor engages in risky social behavior (examples are intravenous drug use or sexual activity with multiple partners). The donor's travel history is screened to minimize risk of transmitting infections, such as malaria. The donated blood is tested for signs of infectious diseases, such as HIV and hepatitis. The blood is then tested to be sure it is compatible with you in order to minimize the chance of a transfusion reaction. If you   or a relative donates blood, this is often done in anticipation of surgery and is not appropriate for emergency situations. It takes many days to process the donated blood. RISKS AND COMPLICATIONS Although transfusion therapy is very safe and saves many lives, the main dangers of transfusion include:  Getting an infectious disease. Developing a transfusion reaction. This is an allergic reaction to something in the blood you were given. Every precaution is taken to prevent this. The decision to have a blood transfusion has been considered carefully by your caregiver before blood is given. Blood is not given unless the benefits outweigh the risks.  AFTER SURGERY INSTRUCTIONS  Return to work: 4-6 weeks if applicable  Activity: 1. Be up and out of the bed during the day.  Take a nap if needed.  You may walk up steps but be careful and use the hand rail.  Stair climbing will tire you more than you think, you may need to stop part way and rest.   2. No lifting or straining for 6 weeks over 10 pounds. No pushing, pulling, straining  for 6 weeks.  3. No driving for around 1 week(s).  Do not drive if you are taking narcotic pain medicine and make sure that your reaction time has returned.   4. You can shower as soon as the next day after surgery. Shower daily.  Use your regular soap and water (not directly on the incision) and pat your incision(s) dry afterwards; don't rub.  No tub baths or submerging your body in water until cleared by your surgeon. If you have the soap that was given to you by pre-surgical testing that was used before surgery, you do not need to use it afterwards because this can irritate your incisions.   5. No sexual activity and nothing in the vagina for 10-12 weeks.  6. You may experience a small amount of clear drainage from your incisions, which is normal.  If the drainage persists, increases, or changes color please call the office.  7. Do not use creams, lotions, or ointments such as neosporin on your incisions after surgery until advised by your surgeon because they can cause removal of the dermabond glue on your incisions.    8. You may experience vaginal spotting after surgery or around the 6-8 week mark from surgery when the stitches at the top of the vagina begin to dissolve.  The spotting is normal but if you experience heavy bleeding, call our office.  9. Take Tylenol first for pain if you are able to take these medications and only use narcotic pain medication for severe pain not relieved by the Tylenol.  Monitor your Tylenol intake to a max of 4,000 mg in a 24 hour period.   Diet: 1. Low sodium Heart Healthy Diet is recommended but you are cleared to resume your normal (before surgery) diet after your procedure.  2. It is safe to use a laxative, such as Miralax or Colace, if you have difficulty moving your bowels. You have been prescribed Sennakot-S to take at bedtime every evening after surgery to keep bowel movements regular and to prevent constipation.    Wound Care: 1. Keep clean and  dry.  Shower daily.  Reasons to call the Doctor: Fever - Oral temperature greater than 100.4 degrees Fahrenheit Foul-smelling vaginal discharge Difficulty urinating Nausea and vomiting Increased pain at the site of the incision that is unrelieved with pain medicine. Difficulty breathing with or without chest pain New calf pain especially if   only on one side Sudden, continuing increased vaginal bleeding with or without clots.   Contacts: For questions or concerns you should contact:  Dr. Katherine Tucker at [phone]  Milica Gully, NP at [phone]  After Hours: call [phone] and have the GYN Oncologist paged/contacted (after 5 pm or on the weekends). You will speak with an after hours RN and let he or she know you have had surgery.  Messages sent via mychart are for non-urgent matters and are not responded to after hours so for urgent needs, please call the after hours number.      

## 2022-11-28 NOTE — TOC Benefit Eligibility Note (Signed)
Patient Product/process development scientist completed.    The patient is currently admitted and upon discharge could be taking Eliquis 5 mg.  The current 30 day co-pay is $11.20.   The patient is currently admitted and upon discharge could be taking Xarelto 20 mg.  The current 30 day co-pay is $11.20.   The patient is insured through [employer]   This test claim was processed through [facility]- copay amounts may vary at other pharmacies due to pharmacy/plan contracts, or as the patient moves through the different stages of their insurance plan.  Roland Earl, CPHT Pharmacy Patient Advocate Specialist [facility] Patient Advocate Team Direct Number: [phone]  Fax: [phone]

## 2022-11-28 NOTE — Progress Notes (Signed)
Patient here for new patient consultation with Dr. Eugene Garnet and for a pre-operative appointment prior to her scheduled surgery on Dec 12, 2022. She is scheduled for robotic assisted total laparoscopic hysterectomy, bilateral salpingo-oophorectomy, possible tumor debulking, possible laparotomy. The surgery was discussed in detail.  See after visit summary for additional details. Visual aids used to discuss items related to surgery.      Discussed post-op pain management in detail including the aspects of the enhanced recovery pathway.  Advised her that a new prescription would be sent in for tramadol and it is only to be used for after her upcoming surgery.  We discussed the use of tylenol post-op and to monitor for a maximum of 4,000 mg in a 24 hour period.  Also prescribed sennakot to be used after surgery and to hold if having loose stools.  Discussed bowel regimen in detail.     Discussed the use of SCDs and measures to take at home to prevent DVT including frequent mobility.  Reportable signs and symptoms of DVT discussed. Post-operative instructions discussed and expectations for after surgery. Incisional care discussed as well including reportable signs and symptoms including erythema, drainage, wound separation.     10 minutes spent with the patient.  Verbalizing understanding of material discussed. No needs or concerns voiced at the end of the visit.   Advised patient to call for any needs.  Advised that her post-operative medications had been prescribed and could be picked up at any time. Instructions for CT scan discussed.    This appointment is included in the global surgical bundle as pre-operative teaching and has no charge.

## 2022-11-29 ENCOUNTER — Inpatient Hospital Stay (HOSPITAL_BASED_OUTPATIENT_CLINIC_OR_DEPARTMENT_OTHER): Payer: Medicare Other | Admitting: Hematology and Oncology

## 2022-11-29 ENCOUNTER — Other Ambulatory Visit: Payer: Self-pay

## 2022-11-29 ENCOUNTER — Encounter: Payer: Self-pay | Admitting: Hematology and Oncology

## 2022-11-29 ENCOUNTER — Encounter: Payer: Self-pay | Admitting: Oncology

## 2022-11-29 VITALS — BP 160/59 | HR 57 | Temp 97.4°F | Resp 18 | Ht 63.0 in | Wt 173.8 lb

## 2022-11-29 DIAGNOSIS — Z79899 Other long term (current) drug therapy: Secondary | ICD-10-CM | POA: Diagnosis not present

## 2022-11-29 DIAGNOSIS — I2693 Single subsegmental pulmonary embolism without acute cor pulmonale: Secondary | ICD-10-CM | POA: Diagnosis not present

## 2022-11-29 DIAGNOSIS — L409 Psoriasis, unspecified: Secondary | ICD-10-CM | POA: Diagnosis not present

## 2022-11-29 DIAGNOSIS — I82412 Acute embolism and thrombosis of left femoral vein: Secondary | ICD-10-CM | POA: Diagnosis not present

## 2022-11-29 DIAGNOSIS — Z803 Family history of malignant neoplasm of breast: Secondary | ICD-10-CM | POA: Diagnosis not present

## 2022-11-29 DIAGNOSIS — N950 Postmenopausal bleeding: Secondary | ICD-10-CM | POA: Diagnosis not present

## 2022-11-29 DIAGNOSIS — Z7901 Long term (current) use of anticoagulants: Secondary | ICD-10-CM | POA: Diagnosis not present

## 2022-11-29 DIAGNOSIS — I2699 Other pulmonary embolism without acute cor pulmonale: Secondary | ICD-10-CM

## 2022-11-29 DIAGNOSIS — F03A0 Unspecified dementia, mild, without behavioral disturbance, psychotic disturbance, mood disturbance, and anxiety: Secondary | ICD-10-CM | POA: Diagnosis not present

## 2022-11-29 DIAGNOSIS — Z7982 Long term (current) use of aspirin: Secondary | ICD-10-CM | POA: Diagnosis not present

## 2022-11-29 DIAGNOSIS — E785 Hyperlipidemia, unspecified: Secondary | ICD-10-CM | POA: Diagnosis not present

## 2022-11-29 DIAGNOSIS — I10 Essential (primary) hypertension: Secondary | ICD-10-CM | POA: Diagnosis not present

## 2022-11-29 DIAGNOSIS — Z800 Family history of malignant neoplasm of digestive organs: Secondary | ICD-10-CM | POA: Diagnosis not present

## 2022-11-29 DIAGNOSIS — C541 Malignant neoplasm of endometrium: Secondary | ICD-10-CM

## 2022-11-29 DIAGNOSIS — Z7189 Other specified counseling: Secondary | ICD-10-CM | POA: Insufficient documentation

## 2022-11-29 NOTE — Assessment & Plan Note (Signed)
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

## 2022-11-29 NOTE — Assessment & Plan Note (Signed)
We have significant discussions about goals of care We discussed importance of advance care planning and dedicated medical healthcare power of attorney We discussed prognosis with or without treatment

## 2022-11-29 NOTE — Progress Notes (Signed)
Requested NGS testing for sarcomas through [employer] with [facility] via email on accession [phone].

## 2022-11-29 NOTE — Progress Notes (Signed)
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 Currently, she is not symptomatic with uterine bleeding Surgery was canceled due to recent diagnosis of DVT and PE requiring continuous, uninterrupted anticoagulation therapy The patient has limited understanding about her disease process due to her cognitive impairment/mild dementia  Her sister, Cristina Barton, was given the impossible test to make medical decisions for her She is undecided and expresses genuine concern over risk of toxicity of chemotherapy We discussed risk and benefits of treatment in the form of neoadjuvant chemotherapy approach We did not commit to any treatment today as her sister needs to have further discussion with other family members regarding the goals of care Ultimately, we are in agreement to meet again next week with other additional family members for further discussion   Acute pulmonary embolus The cause of her DVT and PE is likely related to untreated malignancy and dehydration due to poor oral fluid intake She is doing well on anticoagulation therapy I advised the sister to monitor the patient for signs or symptoms of bleeding  Mild dementia The patient has decline in memory and clinical dementia She is not capable of making medical decisions for herself Her sister agree for social worker consult for advance care planning  Goals of care, counseling/discussion We have significant discussions about goals of care We discussed importance of advance care planning and dedicated medical healthcare power of attorney We discussed prognosis with or without treatment  Orders Placed This Encounter  Procedures   Ambulatory referral to Social Work    Referral Priority:   Routine    Referral Type:   Consultation    Referral Reason:   Specialty Services Required     Number of Visits Requested:   1    The total time spent in the appointment was 60 minutes encounter with patients including review of chart and various tests results, discussions about plan of care and coordination of care plan   All questions were answered. The patient knows to call the clinic with any problems, questions or concerns. No barriers to learning was detected.  Artis Delay, MD 4/25/20242:09 PM  CHIEF COMPLAINTS/PURPOSE OF CONSULTATION:  Uterine cancer, high-grade stroma sarcoma  HISTORY OF PRESENTING ILLNESS:  Cristina Barton 87 y.o. female is here because of recent diagnosis of high-grade stromal sarcoma of the uterus The patient is not able to give much information due to her baseline dementia She is here accompanied by her sister, Cristina Barton She has 1 son who checks on her on a regular basis The patient was diagnosed after presentation with postmenopausal bleeding Currently, she denies vaginal bleeding As part of her preoperative workup, she was scheduled for CT imaging Unfortunately, CT imaging show evidence of pulmonary emboli and she was directed to the hospital and was admitted for management Ultrasound venous Doppler confirms left lower extremity DVT She tolerated anticoagulation therapy well Denies chest pain or shortness of breath or leg pain or swelling Her sister felt that the patient is not drinking enough fluids No recent falls  I have reviewed her chart and materials related to her cancer extensively and collaborated history with the patient. Summary of oncologic history is as follows: Oncology History  Endometrial stromal sarcoma  09/06/2022 Initial Diagnosis   She presented with PMB   10/02/2022 Imaging   US Pelvis  At least 3 uterine leiomyomata, largest  4.9 cm diameter.   Nonvisualization of endometrial complex due to presence of multiple leiomyomata.   In the setting of postmenopausal bleeding and failure to visualized the endometrial complex,  endometrial biopsy is recommended to exclude malignancy.   11/08/2022 Pathology Results   CASE: [id number] PATIENT: Cristina Barton Surgical Pathology Report  Clinical History: Postmenopausal bleeding (crm)  FINAL MICROSCOPIC DIAGNOSIS:  A. ENDOMETRIUM, POLYPECTOMY:      Endometrial stromal sarcoma, high grade and low grade.      See comment.  COMMENT:  The specimen demonstrates malignant epithelioid to spindle cell proliferation with perivascular condensation. Majority of the cells form solid sheets and nests with primitive cytomorphology including smugly chromatin and occasional prominent nucleoli.  Some areas are composed of relatively low grade atypical spindle cells with hypocellularity in a myxoid background. The cells are mitotic active with up to 20 mitoses / 10 high power fields. Numerous apoptotic bodies are readily identified as well as areas with necrosis.  Immunohistochemical stains were performed to characterize the tumor cells. The high-grade primitive cells are strongly and diffusely positive for cyclin D, and largely negative for CD10, ER and PR. The relatively low grade spindle cells are diffusely and strongly positive for CD10, patchy positive for cyclin D and PR, and are negative for ER. Desmin labels admixed myometrium while it is negative in the tumor cells. CK AE1/AE3 does not show evidence of malignant epithelioid component. Ki-67 proliferation index is approximately 50% in the spindle cells and 90% in the primitive cells. CD117 and synaptophysin show patchy positivity. Controls works appropriately.  Overall, we think the features are in keeping with a predominant high grade endometrial stromal sarcoma (primitive cells) with a minor component of low grade stromal sarcoma (spindle cells).    11/23/2022 Initial Diagnosis   Endometrial stromal sarcoma   11/27/2022 Imaging   CT chest abdomen and pelvis 1. Incidental right-sided central and segmental pulmonary emboli. No  evidence of right heart strain. 2. Heterogeneous mass within the uterine fundus, likely a combination of uterine fibroids and the endometrial stromal sarcoma reference in the clinical history. No evidence of extra uterine extension of disease. 3. No evidence of intrathoracic, intra-abdominal, or intrapelvic metastases. 4. Colonic diverticulosis without acute diverticulitis. 5. Aortic atherosclerosis. At least moderate ostial stenosis of the bilateral renal arteries. If further evaluation is clinically indicated, CT angiography could be performed.   11/28/2022 Imaging   BILATERAL:  -No evidence of popliteal cyst, bilaterally.  RIGHT:  - There is no evidence of deep vein thrombosis in the lower extremity.    LEFT:  - Findings consistent with acute deep vein thrombosis involving the left femoral vein, left popliteal vein, left peroneal veins, and left gastrocnemius veins.    11/29/2022 Cancer Staging   Staging form: Corpus Uteri - Leiomyosarcoma and Endometrial Stromal Sarcoma, AJCC 8th Edition - Clinical stage from 11/29/2022: FIGO Stage IB (cT1b, cN0, cM0) - Signed by Artis Delay, MD on 11/29/2022 Stage prefix: Initial diagnosis     MEDICAL HISTORY:  Past Medical History:  Diagnosis Date   Anemia    Arthritis    knees, back   Hyperlipidemia    Hypertension    Memory loss    PMB (postmenopausal bleeding)    Psoriasis    dermatologist---   dr Coralie Carpen;    methotrexate on Mondays   Uterine cancer    Wears glasses     SURGICAL HISTORY: Past Surgical History:  Procedure Laterality Date   COLONOSCOPY     DILATION AND CURETTAGE  OF UTERUS  11/12/2001   @[facility]  by dr Greta Doom   HYSTEROSCOPY WITH D & C N/A 11/08/2022   Procedure: DILATATION AND CURETTAGE /HYSTEROSCOPY/MYOSURE;  Surgeon: Jerene Bears, MD;  Location: [facility];  Service: Gynecology;  Laterality: N/A;   KNEE ARTHROPLASTY  09/08/2012   Procedure: COMPUTER ASSISTED TOTAL KNEE ARTHROPLASTY;  Surgeon: Eldred Manges,  MD;  Location: [facility] OR;  Service: Orthopedics;  Laterality: Left;  Left total knee arthroplasty, cemented   KNEE ARTHROSCOPY Left 03/25/2001   @[facility]  by dr Demetrius Charity. carter   LUMBAR LAMINECTOMY  03/12/2012   Procedure: MICRODISCECTOMY LUMBAR LAMINECTOMY;  Surgeon: Eldred Manges, MD;  Location: [facility] OR;  Service: Orthopedics;  Laterality: N/A;  L3-4, L4-5 Decompression   OPERATIVE ULTRASOUND N/A 11/08/2022   Procedure: OPERATIVE ULTRASOUND;  Surgeon: Jerene Bears, MD;  Location: [facility];  Service: Gynecology;  Laterality: N/A;   TONSILLECTOMY     child    SOCIAL HISTORY: Social History   Socioeconomic History   Marital status: Widowed    Spouse name: Not on file   Number of children: 1   Years of education: Not on file   Highest education level: Not on file  Occupational History   Not on file  Tobacco Use   Smoking status: Never   Smokeless tobacco: Never  Vaping Use   Vaping Use: Never used  Substance and Sexual Activity   Alcohol use: No   Drug use: Never   Sexual activity: Never    Birth control/protection: Post-menopausal  Other Topics Concern   Not on file  Social History Narrative   Not on file   Social Determinants of Health   Financial Resource Strain: Not on file  Food Insecurity: No Food Insecurity (11/27/2022)   Hunger Vital Sign    Worried About Running Out of Food in the Last Year: Never true    Ran Out of Food in the Last Year: Never true  Transportation Needs: No Transportation Needs (11/27/2022)   PRAPARE - Administrator, Civil Service (Medical): No    Lack of Transportation (Non-Medical): No  Physical Activity: Not on file  Stress: Not on file  Social Connections: Not on file  Intimate Partner Violence: Not At Risk (11/27/2022)   Humiliation, Afraid, Rape, and Kick questionnaire    Fear of Current or Ex-Partner: No    Emotionally Abused: No    Physically Abused: No    Sexually Abused: No    FAMILY HISTORY: Family History   Problem Relation Age of Onset   Breast cancer Mother    Breast cancer Sister    Colon cancer Neg Hx    Ovarian cancer Neg Hx    Endometrial cancer Neg Hx    Pancreatic cancer Neg Hx    Prostate cancer Neg Hx     ALLERGIES:  is allergic to sulfa antibiotics.  MEDICATIONS:  Current Outpatient Medications  Medication Sig Dispense Refill   acetaminophen (TYLENOL) 500 MG tablet Take 1,000 mg by mouth every 6 (six) hours as needed for moderate pain.     aspirin EC 81 MG tablet Take 81 mg by mouth daily. Swallow whole.     betamethasone dipropionate 0.05 % cream Apply 1-2 Applications topically daily as needed.     calcium carbonate (OSCAL) 1500 (600 Ca) MG TABS tablet Take 1,500 mg by mouth 2 (two) times daily with a meal. Unsure of dosage     folic acid (FOLVITE) 1 MG tablet Take  1 mg by mouth daily. Unsure of dosage     HYDROcodone-acetaminophen (NORCO/VICODIN) 5-325 MG tablet Take 1 tablet by mouth every 6 (six) hours as needed for moderate pain. (Patient not taking: Reported on 11/28/2022) 10 tablet 0   metoprolol succinate (TOPROL-XL) 50 MG 24 hr tablet Take 50 mg by mouth daily. Take with or immediately following a meal.     pravastatin (PRAVACHOL) 40 MG tablet Take 40 mg by mouth at bedtime.     RIVAROXABAN (XARELTO) VTE STARTER PACK (15 & 20 MG) Follow package directions: Take one 15mg  tablet by mouth twice a day. On day 22, switch to one 20mg  tablet once a day. Take with food. 51 each 0   valsartan (DIOVAN) 80 MG tablet Take 80 mg by mouth daily.     No current facility-administered medications for this visit.    REVIEW OF SYSTEMS:  All other systems were reviewed with the patient and are negative.  PHYSICAL EXAMINATION: ECOG PERFORMANCE STATUS: 1 - Symptomatic but completely ambulatory  Vitals:   11/29/22 1255  BP: (!) 160/59  Pulse: (!) 57  Resp: 18  Temp: (!) 97.4 F (36.3 C)  SpO2: 100%   Filed Weights   11/29/22 1255  Weight: 173 lb 12.8 oz (78.8 kg)     GENERAL:alert, no distress and comfortable SKIN: skin color, texture, turgor are normal, no rashes or significant lesions EYES: normal, conjunctiva are pink and non-injected, sclera clear OROPHARYNX:no exudate, no erythema and lips, buccal mucosa, and tongue normal  NECK: supple, thyroid normal size, non-tender, without nodularity LYMPH:  no palpable lymphadenopathy in the cervical, axillary or inguinal LUNGS: clear to auscultation and percussion with normal breathing effort HEART: regular rate & rhythm and no murmurs and no lower extremity edema ABDOMEN:abdomen soft, non-tender and normal bowel sounds Musculoskeletal:no cyanosis of digits and no clubbing  PSYCH: alert & oriented x 3 with fluent speech NEURO: no focal motor/sensory deficits  LABORATORY DATA:  I have reviewed the data as listed Lab Results  Component Value Date   WBC 6.1 11/28/2022   HGB 12.7 11/28/2022   HCT 39.6 11/28/2022   MCV 91.5 11/28/2022   PLT 161 11/28/2022   Recent Labs    11/08/22 0735 11/27/22 2012  NA 141 135  K 4.2 3.7  CL 104 102  CO2  --  26  GLUCOSE 108* 119*  BUN 9 9  CREATININE 0.80 0.93  CALCIUM  --  9.8  GFRNONAA  --  >60    RADIOGRAPHIC STUDIES: I have personally reviewed the radiological images as listed and agreed with the findings in the report. VAS [place] LOWER EXTREMITY VENOUS (DVT)  Result Date: 11/28/2022  Lower Venous DVT Study Patient Name:  Cristina Barton  Date of Exam:   11/28/2022 Medical Rec #: [id number]      Accession #:    [id number] Date of Birth: 17-Nov-1935       Patient Gender: F Patient Age:   87 years Exam Location:  [facility] Procedure:      VAS [place] LOWER EXTREMITY VENOUS (DVT) Referring Phys: CHING TU --------------------------------------------------------------------------------  Indications: Pulmonary embolism.  Risk Factors: Recent cancer diagnosis. Comparison Study: No previous exams Performing Technologist: Jody Hill RVT, RDMS  Examination Guidelines:  A complete evaluation includes B-mode imaging, spectral Doppler, color Doppler, and power Doppler as needed of all accessible portions of each vessel. Bilateral testing is considered an integral part of a complete examination. Limited examinations for reoccurring indications may be performed as  noted. The reflux portion of the exam is performed with the patient in reverse Trendelenburg.  +---------+---------------+---------+-----------+----------+--------------+ RIGHT    CompressibilityPhasicitySpontaneityPropertiesThrombus Aging +---------+---------------+---------+-----------+----------+--------------+ CFV      Full           Yes      Yes                                 +---------+---------------+---------+-----------+----------+--------------+ SFJ      Full                                                        +---------+---------------+---------+-----------+----------+--------------+ FV Prox  Full           Yes      Yes                                 +---------+---------------+---------+-----------+----------+--------------+ FV Mid   Full           Yes      Yes                                 +---------+---------------+---------+-----------+----------+--------------+ FV DistalFull           Yes      Yes                                 +---------+---------------+---------+-----------+----------+--------------+ PFV      Full                                                        +---------+---------------+---------+-----------+----------+--------------+ POP      Full           Yes      Yes                                 +---------+---------------+---------+-----------+----------+--------------+ PTV      Full                                                        +---------+---------------+---------+-----------+----------+--------------+ PERO     Full                                                         +---------+---------------+---------+-----------+----------+--------------+   +---------+---------------+---------+-----------+----------+----------------+ LEFT     CompressibilityPhasicitySpontaneityPropertiesThrombus Aging   +---------+---------------+---------+-----------+----------+----------------+ CFV      Full           Yes      Yes                                   +---------+---------------+---------+-----------+----------+----------------+  SFJ      Full                                                          +---------+---------------+---------+-----------+----------+----------------+ FV Prox  Full           Yes      Yes                                   +---------+---------------+---------+-----------+----------+----------------+ FV Mid   None           No       No                   Acute            +---------+---------------+---------+-----------+----------+----------------+ FV DistalPartial        Yes      Yes                  Acute            +---------+---------------+---------+-----------+----------+----------------+ PFV      Full                                                          +---------+---------------+---------+-----------+----------+----------------+ POP      None           No       No                   Acute            +---------+---------------+---------+-----------+----------+----------------+ PTV      Full                                                          +---------+---------------+---------+-----------+----------+----------------+ PERO     None           No       No                                    +---------+---------------+---------+-----------+----------+----------------+ Gastroc  Partial        No       No                   Acute - mid calf +---------+---------------+---------+-----------+----------+----------------+     Summary: BILATERAL: -No evidence of popliteal cyst, bilaterally. RIGHT: -  There is no evidence of deep vein thrombosis in the lower extremity.  LEFT: - Findings consistent with acute deep vein thrombosis involving the left femoral vein, left popliteal vein, left peroneal veins, and left gastrocnemius veins.  *See table(s) above for measurements and observations. Electronically signed by Sherald Hess MD on 11/28/2022 at 1:11:34 PM.    Final    CT CHEST ABDOMEN PELVIS W CONTRAST  Result Date: 11/27/2022 CLINICAL DATA:  Recent  diagnosis of endometrial stromal sarcoma, staging evaluation EXAM: CT CHEST, ABDOMEN, AND PELVIS WITH CONTRAST TECHNIQUE: Multidetector CT imaging of the chest, abdomen and pelvis was performed following the standard protocol during bolus administration of intravenous contrast. RADIATION DOSE REDUCTION: This exam was performed according to the departmental dose-optimization program which includes automated exposure control, adjustment of the mA and/or kV according to patient size and/or use of iterative reconstruction technique. CONTRAST:  OMNIPAQUE IOHEXOL 300 MG/ML  SOLN COMPARISON:  10/02/2022 FINDINGS: CT CHEST FINDINGS Cardiovascular: While not optimized for the opacification of the pulmonary vasculature, incidental right-sided pulmonary emboli are identified. Pulmonary emboli extend from the right main pulmonary artery into the right upper and lower lobe segmental and subsegmental branches. Mild to moderate clot burden with no evidence of right heart strain. The heart is otherwise unremarkable without pericardial effusion. No evidence of thoracic aortic aneurysm or dissection. Atherosclerosis of the aorta and coronary vasculature. Mediastinum/Nodes: No pathologic mediastinal, hilar, or axillary adenopathy. Trachea and esophagus are unremarkable. Lungs/Pleura: No acute airspace disease, effusion, or pneumothorax. Central airways are patent. Musculoskeletal: There are no acute or destructive bony lesions. Reconstructed images demonstrate no additional  findings. CT ABDOMEN PELVIS FINDINGS Hepatobiliary: 2.1 cm simple cyst within the left lobe liver. No other focal liver abnormalities. No biliary duct dilation. Gallbladder is decompressed, with no evidence of cholelithiasis or cholecystitis. Pancreas: Unremarkable. No pancreatic ductal dilatation or surrounding inflammatory changes. Spleen: Normal in size without focal abnormality. Adrenals/Urinary Tract: Simple appearing 1.9 cm cyst within the mid right kidney does not require specific imaging follow-up. Otherwise the kidneys enhance normally. No urinary tract calculi or obstructive uropathy. The adrenals and bladder are grossly unremarkable. Stomach/Bowel: No bowel obstruction or ileus. Normal appendix right lower quadrant. Diffuse colonic diverticulosis without evidence of acute diverticulitis. No bowel wall thickening or inflammatory change. Vascular/Lymphatic: Atherosclerosis of the abdominal aorta and its branches. This is most pronounced within the bilateral renal arteries, with at least moderate ostial stenosis suspected bilaterally. No pathologic adenopathy within the abdomen or pelvis. Reproductive: Heterogeneous 5.4 x 7.6 x 5.8 cm mass within the uterine fundus, may reflect a combination of uterine fibroids and the endometrial stromal sarcoma referenced in the clinical history. There is no evidence of extra uterine extension. There are no adnexal masses. Other: No free fluid or free intraperitoneal gas. No abdominal wall hernia. Musculoskeletal: No acute or destructive bony lesions. Reconstructed images demonstrate no additional findings. IMPRESSION: 1. Incidental right-sided central and segmental pulmonary emboli. No evidence of right heart strain. 2. Heterogeneous mass within the uterine fundus, likely a combination of uterine fibroids and the endometrial stromal sarcoma reference in the clinical history. No evidence of extra uterine extension of disease. 3. No evidence of intrathoracic,  intra-abdominal, or intrapelvic metastases. 4. Colonic diverticulosis without acute diverticulitis. 5. Aortic atherosclerosis. At least moderate ostial stenosis of the bilateral renal arteries. If further evaluation is clinically indicated, CT angiography could be performed. Critical Value/emergent results were called by telephone at the time of interpretation on 11/27/2022 at 5:48 pm to provider Eugene Garnet , who verbally acknowledged these results. Electronically Signed   By: Sharlet Salina M.D.   On: 11/27/2022 17:53   [place] Intraoperative  Result Date: 11/08/2022 CLINICAL DATA:  Ultrasound was provided for use by the ordering physician.  No provider Interpretation or professional fees incurred.

## 2022-11-29 NOTE — Assessment & Plan Note (Signed)
The patient has decline in memory and clinical dementia She is not capable of making medical decisions for herself Her sister agree for social worker consult for advance care planning

## 2022-11-29 NOTE — Assessment & Plan Note (Signed)
I have reviewed imaging study independently Currently, she is not symptomatic with uterine bleeding Surgery was canceled due to recent diagnosis of DVT and PE requiring continuous, uninterrupted anticoagulation therapy The patient has limited understanding about her disease process due to her cognitive impairment/mild dementia  Her sister, [name], was given the impossible test to make medical decisions for her She is undecided and expresses genuine concern over risk of toxicity of chemotherapy We discussed risk and benefits of treatment in the form of neoadjuvant chemotherapy approach We did not commit to any treatment today as her sister needs to have further discussion with other family members regarding the goals of care Ultimately, we are in agreement to meet again next week with other additional family members for further discussion

## 2022-11-30 ENCOUNTER — Telehealth: Payer: Self-pay | Admitting: Licensed Clinical Social Worker

## 2022-11-30 NOTE — Telephone Encounter (Signed)
[facility] Clinical Social Work  Clinical Social Work was referred by medical provider for advance directives.  Clinical Social Worker  attempted to contact pt's sister by phone   to offer support and assess for needs.   No answer. Left VM with direct contact information.   Unfortunately, due to pt's dementia, per MD, she will not be able to complete her own AD paperwork.  CSW plans to share information with family on the decision making order in [place] in this case if pt does not have prior POA paperwork completed.    Cristina Barton E Lecretia Buczek, LCSW  Clinical Social Worker [facility]

## 2022-11-30 NOTE — Telephone Encounter (Signed)
Pt's sister returned call to this CSW.  Per sister, she helps where she can but pt's son is primary Management consultant. They were wondering if paperwork needed to be signed for this. CSW explained that HCPOA paperwork would typically be signed by pt's who can make that informed decision themselves. Per [facility] decision making order, if no paperwork is in place for POA (this pt does not have any), decisions automatically fall to spouse (if there is one) and then to majority of available parents and adult children.  In this pt's case, she has one adult son and that is who the family agrees will make decisions.  No other needs at this time.   Dominie Benedick E Tyshaun Vinzant, LCSW

## 2022-12-04 DIAGNOSIS — Z23 Encounter for immunization: Secondary | ICD-10-CM | POA: Diagnosis not present

## 2022-12-04 DIAGNOSIS — I2699 Other pulmonary embolism without acute cor pulmonale: Secondary | ICD-10-CM | POA: Diagnosis not present

## 2022-12-04 DIAGNOSIS — C541 Malignant neoplasm of endometrium: Secondary | ICD-10-CM | POA: Diagnosis not present

## 2022-12-04 NOTE — Telephone Encounter (Signed)
Patient's surgery canceled.

## 2022-12-05 ENCOUNTER — Encounter (HOSPITAL_BASED_OUTPATIENT_CLINIC_OR_DEPARTMENT_OTHER): Payer: Self-pay | Admitting: Obstetrics & Gynecology

## 2022-12-06 ENCOUNTER — Encounter: Payer: Self-pay | Admitting: Hematology and Oncology

## 2022-12-06 ENCOUNTER — Telehealth: Payer: Self-pay

## 2022-12-06 ENCOUNTER — Other Ambulatory Visit: Payer: Self-pay

## 2022-12-06 ENCOUNTER — Inpatient Hospital Stay: Payer: Medicare Other | Attending: Gynecologic Oncology | Admitting: Hematology and Oncology

## 2022-12-06 VITALS — BP 118/82 | HR 63 | Temp 99.1°F | Resp 18 | Ht 63.0 in | Wt 173.8 lb

## 2022-12-06 DIAGNOSIS — I2693 Single subsegmental pulmonary embolism without acute cor pulmonale: Secondary | ICD-10-CM | POA: Diagnosis not present

## 2022-12-06 DIAGNOSIS — Z7901 Long term (current) use of anticoagulants: Secondary | ICD-10-CM | POA: Diagnosis not present

## 2022-12-06 DIAGNOSIS — I2699 Other pulmonary embolism without acute cor pulmonale: Secondary | ICD-10-CM

## 2022-12-06 DIAGNOSIS — Z7189 Other specified counseling: Secondary | ICD-10-CM | POA: Diagnosis not present

## 2022-12-06 DIAGNOSIS — C541 Malignant neoplasm of endometrium: Secondary | ICD-10-CM | POA: Insufficient documentation

## 2022-12-06 DIAGNOSIS — F0390 Unspecified dementia without behavioral disturbance: Secondary | ICD-10-CM | POA: Insufficient documentation

## 2022-12-06 NOTE — Assessment & Plan Note (Signed)
We have extensive goals of care discussion today Her son and daughter-in-law, are in line with her sister's wishes They are in agreement that she should not proceed with aggressive monitoring or treatment We discussed referral for palliative care with hospice and they are in agreement We discussed CODE STATUS and they agree that she should not be resuscitated I gave her a DNR order

## 2022-12-06 NOTE — Progress Notes (Signed)
[facility] OFFICE PROGRESS NOTE  Patient Care Team: Hamrick, Durward Fortes, MD as PCP - General (Family Medicine)  ASSESSMENT & PLAN:  Endometrial stromal sarcoma (HCC) I have reviewed imaging studies with the patient with family The patient has high-grade endometrial stromal cancer, complicated by recent diagnosis of pulmonary emboli This is on background history of dementia and poor baseline performance status Previously, I made a mistake of recommending chemotherapy with carboplatin and paclitaxel With high-grade endometrial stromal cancer, she is not a candidate for doxorubicin.  Treatment choice would be gemcitabine with Taxotere and the side effects were reviewed today Ultimately, due to her dementia, she is not considered capable of making informed decision Her sister, her son and daughter-in-law all agreed that she should not be receiving chemotherapy for this  Moving forward, we also discussed the timing of anticoagulation therapy and potential surgery in the future Her family has made informed decision not to pursue surgery I recommend palliative care and hospice referral and they are in agreement  Acute pulmonary embolus (HCC) She is doing well with anticoagulation therapy so far I warned the family about risk of heavy bleeding in the future while on Xarelto For now, she will continue Xarelto indefinitely  Goals of care, counseling/discussion We have extensive goals of care discussion today Her son and daughter-in-law, are in line with her sister's wishes They are in agreement that she should not proceed with aggressive monitoring or treatment We discussed referral for palliative care with hospice and they are in agreement We discussed CODE STATUS and they agree that she should not be resuscitated I gave her a DNR order  Orders Placed This Encounter  Procedures   Ambulatory referral to Hospice    Referral Priority:   Routine    Referral Type:   Consultation     Referral Reason:   Advance Care Planning    Requested Specialty:   Hospice Services    Number of Visits Requested:   1    All questions were answered. The patient knows to call the clinic with any problems, questions or concerns. The total time spent in the appointment was 40 minutes encounter with patients including review of chart and various tests results, discussions about plan of care and coordination of care plan   Artis Delay, MD 12/06/2022 1:10 PM  INTERVAL HISTORY: Please see below for problem oriented charting. she returns for further discussion regarding plan of care She is here accompanied by multiple family members Her son, Hinton Dyer and daughter-in-law, Sunny Schlein are present.  Her sister is present as well I summarized our findings so far I reviewed imaging studies with her family and discussed plan of care Due to recent diagnosis of PE, she is not a candidate for surgery.  We discussed the role of neoadjuvant chemotherapy Ultimately, multiple family members have made informed decision that the patient should not undergo further treatment The patient herself is not symptomatic  REVIEW OF SYSTEMS:   Constitutional: Denies fevers, chills or abnormal weight loss Eyes: Denies blurriness of vision Ears, nose, mouth, throat, and face: Denies mucositis or sore throat Respiratory: Denies cough, dyspnea or wheezes Cardiovascular: Denies palpitation, chest discomfort or lower extremity swelling Gastrointestinal:  Denies nausea, heartburn or change in bowel habits Skin: Denies abnormal skin rashes Lymphatics: Denies new lymphadenopathy or easy bruising Neurological:Denies numbness, tingling or new weaknesses Behavioral/Psych: Mood is stable, no new changes  All other systems were reviewed with the patient and are negative.  I have reviewed the past  medical history, past surgical history, social history and family history with the patient and they are unchanged from previous  note.  ALLERGIES:  is allergic to sulfa antibiotics.  MEDICATIONS:  Current Outpatient Medications  Medication Sig Dispense Refill   acetaminophen (TYLENOL) 500 MG tablet Take 1,000 mg by mouth every 6 (six) hours as needed for moderate pain.     aspirin EC 81 MG tablet Take 81 mg by mouth daily. Swallow whole.     betamethasone dipropionate 0.05 % cream Apply 1-2 Applications topically daily as needed.     calcium carbonate (OSCAL) 1500 (600 Ca) MG TABS tablet Take 1,500 mg by mouth 2 (two) times daily with a meal. Unsure of dosage     folic acid (FOLVITE) 1 MG tablet Take 1 mg by mouth daily. Unsure of dosage     HYDROcodone-acetaminophen (NORCO/VICODIN) 5-325 MG tablet Take 1 tablet by mouth every 6 (six) hours as needed for moderate pain. (Patient not taking: Reported on 11/28/2022) 10 tablet 0   metoprolol succinate (TOPROL-XL) 50 MG 24 hr tablet Take 50 mg by mouth daily. Take with or immediately following a meal.     pravastatin (PRAVACHOL) 40 MG tablet Take 40 mg by mouth at bedtime.     RIVAROXABAN (XARELTO) VTE STARTER PACK (15 & 20 MG) Follow package directions: Take one 15mg  tablet by mouth twice a day. On day 22, switch to one 20mg  tablet once a day. Take with food. 51 each 0   valsartan (DIOVAN) 80 MG tablet Take 80 mg by mouth daily.     No current facility-administered medications for this visit.    SUMMARY OF ONCOLOGIC HISTORY: Oncology History  Endometrial stromal sarcoma (HCC)  09/06/2022 Initial Diagnosis   She presented with PMB   10/02/2022 Imaging   US Pelvis  At least 3 uterine leiomyomata, largest 4.9 cm diameter.   Nonvisualization of endometrial complex due to presence of multiple leiomyomata.   In the setting of postmenopausal bleeding and failure to visualized the endometrial complex, endometrial biopsy is recommended to exclude malignancy.   11/08/2022 Pathology Results   CASE: [id number] PATIENT: Cristina Barton Surgical Pathology Report  Clinical  History: Postmenopausal bleeding (crm)  FINAL MICROSCOPIC DIAGNOSIS:  A. ENDOMETRIUM, POLYPECTOMY:      Endometrial stromal sarcoma, high grade and low grade.      See comment.  COMMENT:  The specimen demonstrates malignant epithelioid to spindle cell proliferation with perivascular condensation. Majority of the cells form solid sheets and nests with primitive cytomorphology including smugly chromatin and occasional prominent nucleoli.  Some areas are composed of relatively low grade atypical spindle cells with hypocellularity in a myxoid background. The cells are mitotic active with up to 20 mitoses / 10 high power fields. Numerous apoptotic bodies are readily identified as well as areas with necrosis.  Immunohistochemical stains were performed to characterize the tumor cells. The high-grade primitive cells are strongly and diffusely positive for cyclin D, and largely negative for CD10, ER and PR. The relatively low grade spindle cells are diffusely and strongly positive for CD10, patchy positive for cyclin D and PR, and are negative for ER. Desmin labels admixed myometrium while it is negative in the tumor cells. CK AE1/AE3 does not show evidence of malignant epithelioid component. Ki-67 proliferation index is approximately 50% in the spindle cells and 90% in the primitive cells. CD117 and synaptophysin show patchy positivity. Controls works appropriately.  Overall, we think the features are in keeping with a predominant high grade endometrial  stromal sarcoma (primitive cells) with a minor component of low grade stromal sarcoma (spindle cells).    11/23/2022 Initial Diagnosis   Endometrial stromal sarcoma   11/27/2022 Imaging   CT chest abdomen and pelvis 1. Incidental right-sided central and segmental pulmonary emboli. No evidence of right heart strain. 2. Heterogeneous mass within the uterine fundus, likely a combination of uterine fibroids and the endometrial stromal sarcoma reference in  the clinical history. No evidence of extra uterine extension of disease. 3. No evidence of intrathoracic, intra-abdominal, or intrapelvic metastases. 4. Colonic diverticulosis without acute diverticulitis. 5. Aortic atherosclerosis. At least moderate ostial stenosis of the bilateral renal arteries. If further evaluation is clinically indicated, CT angiography could be performed.   11/28/2022 Imaging   BILATERAL:  -No evidence of popliteal cyst, bilaterally.  RIGHT:  - There is no evidence of deep vein thrombosis in the lower extremity.    LEFT:  - Findings consistent with acute deep vein thrombosis involving the left femoral vein, left popliteal vein, left peroneal veins, and left gastrocnemius veins.    11/29/2022 Cancer Staging   Staging form: Corpus Uteri - Leiomyosarcoma and Endometrial Stromal Sarcoma, AJCC 8th Edition - Clinical stage from 11/29/2022: FIGO Stage IB (cT1b, cN0, cM0) - Signed by Artis Delay, MD on 11/29/2022 Stage prefix: Initial diagnosis     PHYSICAL EXAMINATION: ECOG PERFORMANCE STATUS: 1 - Symptomatic but completely ambulatory  Vitals:   12/06/22 1253  BP: 118/82  Pulse: 63  Resp: 18  Temp: 99.1 F (37.3 C)  SpO2: 98%   Filed Weights   12/06/22 1253  Weight: 173 lb 12.8 oz (78.8 kg)    GENERAL:alert, no distress and comfortable NEURO: alert   LABORATORY DATA:  I have reviewed the data as listed    Component Value Date/Time   NA 135 11/27/2022 2012   K 3.7 11/27/2022 2012   CL 102 11/27/2022 2012   CO2 26 11/27/2022 2012   GLUCOSE 119 (H) 11/27/2022 2012   BUN 9 11/27/2022 2012   CREATININE 0.93 11/27/2022 2012   CALCIUM 9.8 11/27/2022 2012   PROT 6.8 09/01/2012 1015   ALBUMIN 3.6 09/01/2012 1015   AST 17 09/01/2012 1015   ALT 14 09/01/2012 1015   ALKPHOS 116 09/01/2012 1015   BILITOT 0.3 09/01/2012 1015   GFRNONAA >60 11/27/2022 2012   GFRAA >90 09/09/2012 0415    No results found for: "SPEP", "UPEP"  Lab Results  Component Value  Date   WBC 6.1 11/28/2022   NEUTROABS 4.1 11/27/2022   HGB 12.7 11/28/2022   HCT 39.6 11/28/2022   MCV 91.5 11/28/2022   PLT 161 11/28/2022      Chemistry      Component Value Date/Time   NA 135 11/27/2022 2012   K 3.7 11/27/2022 2012   CL 102 11/27/2022 2012   CO2 26 11/27/2022 2012   BUN 9 11/27/2022 2012   CREATININE 0.93 11/27/2022 2012      Component Value Date/Time   CALCIUM 9.8 11/27/2022 2012   ALKPHOS 116 09/01/2012 1015   AST 17 09/01/2012 1015   ALT 14 09/01/2012 1015   BILITOT 0.3 09/01/2012 1015       RADIOGRAPHIC STUDIES: I have reviewed imaging study with the patient and family I have personally reviewed the radiological images as listed and agreed with the findings in the report. VAS [place] LOWER EXTREMITY VENOUS (DVT)  Result Date: 11/28/2022  Lower Venous DVT Study Patient Name:  [name]  Date of Exam:  11/28/2022 Medical Rec #: [id number]      Accession #:    [id number] Date of Birth: [birth year]       Patient Gender: F Patient Age:   87 years Exam Location:  [facility] Procedure:      VAS [place] LOWER EXTREMITY VENOUS (DVT) Referring Phys: CHING TU --------------------------------------------------------------------------------  Indications: Pulmonary embolism.  Risk Factors: Recent cancer diagnosis. Comparison Study: No previous exams Performing Technologist: Jody Hill RVT, RDMS  Examination Guidelines: A complete evaluation includes B-mode imaging, spectral Doppler, color Doppler, and power Doppler as needed of all accessible portions of each vessel. Bilateral testing is considered an integral part of a complete examination. Limited examinations for reoccurring indications may be performed as noted. The reflux portion of the exam is performed with the patient in reverse Trendelenburg.  +---------+---------------+---------+-----------+----------+--------------+ RIGHT    CompressibilityPhasicitySpontaneityPropertiesThrombus Aging  +---------+---------------+---------+-----------+----------+--------------+ CFV      Full           Yes      Yes                                 +---------+---------------+---------+-----------+----------+--------------+ SFJ      Full                                                        +---------+---------------+---------+-----------+----------+--------------+ FV Prox  Full           Yes      Yes                                 +---------+---------------+---------+-----------+----------+--------------+ FV Mid   Full           Yes      Yes                                 +---------+---------------+---------+-----------+----------+--------------+ FV DistalFull           Yes      Yes                                 +---------+---------------+---------+-----------+----------+--------------+ PFV      Full                                                        +---------+---------------+---------+-----------+----------+--------------+ POP      Full           Yes      Yes                                 +---------+---------------+---------+-----------+----------+--------------+ PTV      Full                                                        +---------+---------------+---------+-----------+----------+--------------+  PERO     Full                                                        +---------+---------------+---------+-----------+----------+--------------+   +---------+---------------+---------+-----------+----------+----------------+ LEFT     CompressibilityPhasicitySpontaneityPropertiesThrombus Aging   +---------+---------------+---------+-----------+----------+----------------+ CFV      Full           Yes      Yes                                   +---------+---------------+---------+-----------+----------+----------------+ SFJ      Full                                                           +---------+---------------+---------+-----------+----------+----------------+ FV Prox  Full           Yes      Yes                                   +---------+---------------+---------+-----------+----------+----------------+ FV Mid   None           No       No                   Acute            +---------+---------------+---------+-----------+----------+----------------+ FV DistalPartial        Yes      Yes                  Acute            +---------+---------------+---------+-----------+----------+----------------+ PFV      Full                                                          +---------+---------------+---------+-----------+----------+----------------+ POP      None           No       No                   Acute            +---------+---------------+---------+-----------+----------+----------------+ PTV      Full                                                          +---------+---------------+---------+-----------+----------+----------------+ PERO     None           No       No                                    +---------+---------------+---------+-----------+----------+----------------+  Gastroc  Partial        No       No                   Acute - mid calf +---------+---------------+---------+-----------+----------+----------------+     Summary: BILATERAL: -No evidence of popliteal cyst, bilaterally. RIGHT: - There is no evidence of deep vein thrombosis in the lower extremity.  LEFT: - Findings consistent with acute deep vein thrombosis involving the left femoral vein, left popliteal vein, left peroneal veins, and left gastrocnemius veins.  *See table(s) above for measurements and observations. Electronically signed by Sherald Hess MD on 11/28/2022 at 1:11:34 PM.    Final    CT CHEST ABDOMEN PELVIS W CONTRAST  Result Date: 11/27/2022 CLINICAL DATA:  Recent diagnosis of endometrial stromal sarcoma, staging evaluation EXAM: CT CHEST,  ABDOMEN, AND PELVIS WITH CONTRAST TECHNIQUE: Multidetector CT imaging of the chest, abdomen and pelvis was performed following the standard protocol during bolus administration of intravenous contrast. RADIATION DOSE REDUCTION: This exam was performed according to the departmental dose-optimization program which includes automated exposure control, adjustment of the mA and/or kV according to patient size and/or use of iterative reconstruction technique. CONTRAST:  OMNIPAQUE IOHEXOL 300 MG/ML  SOLN COMPARISON:  10/02/2022 FINDINGS: CT CHEST FINDINGS Cardiovascular: While not optimized for the opacification of the pulmonary vasculature, incidental right-sided pulmonary emboli are identified. Pulmonary emboli extend from the right main pulmonary artery into the right upper and lower lobe segmental and subsegmental branches. Mild to moderate clot burden with no evidence of right heart strain. The heart is otherwise unremarkable without pericardial effusion. No evidence of thoracic aortic aneurysm or dissection. Atherosclerosis of the aorta and coronary vasculature. Mediastinum/Nodes: No pathologic mediastinal, hilar, or axillary adenopathy. Trachea and esophagus are unremarkable. Lungs/Pleura: No acute airspace disease, effusion, or pneumothorax. Central airways are patent. Musculoskeletal: There are no acute or destructive bony lesions. Reconstructed images demonstrate no additional findings. CT ABDOMEN PELVIS FINDINGS Hepatobiliary: 2.1 cm simple cyst within the left lobe liver. No other focal liver abnormalities. No biliary duct dilation. Gallbladder is decompressed, with no evidence of cholelithiasis or cholecystitis. Pancreas: Unremarkable. No pancreatic ductal dilatation or surrounding inflammatory changes. Spleen: Normal in size without focal abnormality. Adrenals/Urinary Tract: Simple appearing 1.9 cm cyst within the mid right kidney does not require specific imaging follow-up. Otherwise the kidneys  enhance normally. No urinary tract calculi or obstructive uropathy. The adrenals and bladder are grossly unremarkable. Stomach/Bowel: No bowel obstruction or ileus. Normal appendix right lower quadrant. Diffuse colonic diverticulosis without evidence of acute diverticulitis. No bowel wall thickening or inflammatory change. Vascular/Lymphatic: Atherosclerosis of the abdominal aorta and its branches. This is most pronounced within the bilateral renal arteries, with at least moderate ostial stenosis suspected bilaterally. No pathologic adenopathy within the abdomen or pelvis. Reproductive: Heterogeneous 5.4 x 7.6 x 5.8 cm mass within the uterine fundus, may reflect a combination of uterine fibroids and the endometrial stromal sarcoma referenced in the clinical history. There is no evidence of extra uterine extension. There are no adnexal masses. Other: No free fluid or free intraperitoneal gas. No abdominal wall hernia. Musculoskeletal: No acute or destructive bony lesions. Reconstructed images demonstrate no additional findings. IMPRESSION: 1. Incidental right-sided central and segmental pulmonary emboli. No evidence of right heart strain. 2. Heterogeneous mass within the uterine fundus, likely a combination of uterine fibroids and the endometrial stromal sarcoma reference in the clinical history. No evidence of extra uterine extension of disease. 3. No evidence of intrathoracic, intra-abdominal,  or intrapelvic metastases. 4. Colonic diverticulosis without acute diverticulitis. 5. Aortic atherosclerosis. At least moderate ostial stenosis of the bilateral renal arteries. If further evaluation is clinically indicated, CT angiography could be performed. Critical Value/emergent results were called by telephone at the time of interpretation on 11/27/2022 at 5:48 pm to provider Eugene Garnet , who verbally acknowledged these results. Electronically Signed   By: Sharlet Salina M.D.   On: 11/27/2022 17:53   [place]  Intraoperative  Result Date: 11/08/2022 CLINICAL DATA:  Ultrasound was provided for use by the ordering physician.  No provider Interpretation or professional fees incurred.

## 2022-12-06 NOTE — Telephone Encounter (Signed)
Called PCP, Dr. Nathanial Rancher office and spoke with office staff. Told a referral was sent to [facility] [place].

## 2022-12-06 NOTE — Assessment & Plan Note (Signed)
She is doing well with anticoagulation therapy so far I warned the family about risk of heavy bleeding in the future while on Xarelto For now, she will continue Xarelto indefinitely

## 2022-12-06 NOTE — Telephone Encounter (Signed)
Called referral to [facility] [place]. Spoke with Drenda Freeze. She will contact sister and get [facility] admitted.

## 2022-12-06 NOTE — Assessment & Plan Note (Signed)
I have reviewed imaging studies with the patient with family The patient has high-grade endometrial stromal cancer, complicated by recent diagnosis of pulmonary emboli This is on background history of dementia and poor baseline performance status Previously, I made a mistake of recommending chemotherapy with carboplatin and paclitaxel With high-grade endometrial stromal cancer, she is not a candidate for doxorubicin.  Treatment choice would be gemcitabine with Taxotere and the side effects were reviewed today Ultimately, due to her dementia, she is not considered capable of making informed decision Her sister, her son and daughter-in-law all agreed that she should not be receiving chemotherapy for this  Moving forward, we also discussed the timing of anticoagulation therapy and potential surgery in the future Her family has made informed decision not to pursue surgery I recommend palliative care and hospice referral and they are in agreement

## 2022-12-06 NOTE — Telephone Encounter (Signed)
-----   Message from Artis Delay, MD sent at 12/06/2022  1:15 PM EDT ----- Pls send referral to [facility]

## 2022-12-07 ENCOUNTER — Encounter (HOSPITAL_COMMUNITY): Payer: Medicare Other

## 2022-12-11 DIAGNOSIS — F0390 Unspecified dementia without behavioral disturbance: Secondary | ICD-10-CM | POA: Diagnosis not present

## 2022-12-11 DIAGNOSIS — C541 Malignant neoplasm of endometrium: Secondary | ICD-10-CM | POA: Diagnosis not present

## 2022-12-11 DIAGNOSIS — E785 Hyperlipidemia, unspecified: Secondary | ICD-10-CM | POA: Diagnosis not present

## 2022-12-11 DIAGNOSIS — L409 Psoriasis, unspecified: Secondary | ICD-10-CM | POA: Diagnosis not present

## 2022-12-11 DIAGNOSIS — I82402 Acute embolism and thrombosis of unspecified deep veins of left lower extremity: Secondary | ICD-10-CM | POA: Diagnosis not present

## 2022-12-11 DIAGNOSIS — I10 Essential (primary) hypertension: Secondary | ICD-10-CM | POA: Diagnosis not present

## 2022-12-11 DIAGNOSIS — I2699 Other pulmonary embolism without acute cor pulmonale: Secondary | ICD-10-CM | POA: Diagnosis not present

## 2022-12-12 ENCOUNTER — Ambulatory Visit: Admit: 2022-12-12 | Payer: Medicare Other | Admitting: Gynecologic Oncology

## 2022-12-12 DIAGNOSIS — C541 Malignant neoplasm of endometrium: Secondary | ICD-10-CM

## 2022-12-12 SURGERY — HYSTERECTOMY, TOTAL, ROBOT-ASSISTED, LAPAROSCOPIC, WITH BILATERAL SALPINGO-OOPHORECTOMY
Anesthesia: General

## 2022-12-14 DIAGNOSIS — I82402 Acute embolism and thrombosis of unspecified deep veins of left lower extremity: Secondary | ICD-10-CM | POA: Diagnosis not present

## 2022-12-14 DIAGNOSIS — C541 Malignant neoplasm of endometrium: Secondary | ICD-10-CM | POA: Diagnosis not present

## 2022-12-14 DIAGNOSIS — F0390 Unspecified dementia without behavioral disturbance: Secondary | ICD-10-CM | POA: Diagnosis not present

## 2022-12-14 DIAGNOSIS — I2699 Other pulmonary embolism without acute cor pulmonale: Secondary | ICD-10-CM | POA: Diagnosis not present

## 2022-12-14 DIAGNOSIS — E785 Hyperlipidemia, unspecified: Secondary | ICD-10-CM | POA: Diagnosis not present

## 2022-12-14 DIAGNOSIS — I10 Essential (primary) hypertension: Secondary | ICD-10-CM | POA: Diagnosis not present

## 2022-12-17 ENCOUNTER — Encounter (HOSPITAL_COMMUNITY): Payer: Self-pay | Admitting: Gynecologic Oncology

## 2022-12-19 ENCOUNTER — Telehealth: Payer: 59 | Admitting: Gynecologic Oncology

## 2022-12-19 DIAGNOSIS — I82402 Acute embolism and thrombosis of unspecified deep veins of left lower extremity: Secondary | ICD-10-CM | POA: Diagnosis not present

## 2022-12-19 DIAGNOSIS — I2699 Other pulmonary embolism without acute cor pulmonale: Secondary | ICD-10-CM | POA: Diagnosis not present

## 2022-12-19 DIAGNOSIS — C541 Malignant neoplasm of endometrium: Secondary | ICD-10-CM | POA: Diagnosis not present

## 2022-12-19 DIAGNOSIS — F0390 Unspecified dementia without behavioral disturbance: Secondary | ICD-10-CM | POA: Diagnosis not present

## 2022-12-19 DIAGNOSIS — E785 Hyperlipidemia, unspecified: Secondary | ICD-10-CM | POA: Diagnosis not present

## 2022-12-19 DIAGNOSIS — I10 Essential (primary) hypertension: Secondary | ICD-10-CM | POA: Diagnosis not present

## 2022-12-24 DIAGNOSIS — I10 Essential (primary) hypertension: Secondary | ICD-10-CM | POA: Diagnosis not present

## 2022-12-24 DIAGNOSIS — I2699 Other pulmonary embolism without acute cor pulmonale: Secondary | ICD-10-CM | POA: Diagnosis not present

## 2022-12-24 DIAGNOSIS — I82402 Acute embolism and thrombosis of unspecified deep veins of left lower extremity: Secondary | ICD-10-CM | POA: Diagnosis not present

## 2022-12-24 DIAGNOSIS — C541 Malignant neoplasm of endometrium: Secondary | ICD-10-CM | POA: Diagnosis not present

## 2022-12-24 DIAGNOSIS — E785 Hyperlipidemia, unspecified: Secondary | ICD-10-CM | POA: Diagnosis not present

## 2022-12-24 DIAGNOSIS — F0390 Unspecified dementia without behavioral disturbance: Secondary | ICD-10-CM | POA: Diagnosis not present

## 2023-01-01 DIAGNOSIS — F0390 Unspecified dementia without behavioral disturbance: Secondary | ICD-10-CM | POA: Diagnosis not present

## 2023-01-01 DIAGNOSIS — E785 Hyperlipidemia, unspecified: Secondary | ICD-10-CM | POA: Diagnosis not present

## 2023-01-01 DIAGNOSIS — C541 Malignant neoplasm of endometrium: Secondary | ICD-10-CM | POA: Diagnosis not present

## 2023-01-01 DIAGNOSIS — I2699 Other pulmonary embolism without acute cor pulmonale: Secondary | ICD-10-CM | POA: Diagnosis not present

## 2023-01-01 DIAGNOSIS — I82402 Acute embolism and thrombosis of unspecified deep veins of left lower extremity: Secondary | ICD-10-CM | POA: Diagnosis not present

## 2023-01-01 DIAGNOSIS — I10 Essential (primary) hypertension: Secondary | ICD-10-CM | POA: Diagnosis not present

## 2023-01-04 ENCOUNTER — Encounter: Payer: 59 | Admitting: Gynecologic Oncology

## 2023-01-05 DIAGNOSIS — I82402 Acute embolism and thrombosis of unspecified deep veins of left lower extremity: Secondary | ICD-10-CM | POA: Diagnosis not present

## 2023-01-05 DIAGNOSIS — F0390 Unspecified dementia without behavioral disturbance: Secondary | ICD-10-CM | POA: Diagnosis not present

## 2023-01-05 DIAGNOSIS — I2699 Other pulmonary embolism without acute cor pulmonale: Secondary | ICD-10-CM | POA: Diagnosis not present

## 2023-01-05 DIAGNOSIS — C541 Malignant neoplasm of endometrium: Secondary | ICD-10-CM | POA: Diagnosis not present

## 2023-01-05 DIAGNOSIS — I10 Essential (primary) hypertension: Secondary | ICD-10-CM | POA: Diagnosis not present

## 2023-01-05 DIAGNOSIS — E785 Hyperlipidemia, unspecified: Secondary | ICD-10-CM | POA: Diagnosis not present

## 2023-01-05 DIAGNOSIS — L409 Psoriasis, unspecified: Secondary | ICD-10-CM | POA: Diagnosis not present

## 2023-01-09 DIAGNOSIS — I10 Essential (primary) hypertension: Secondary | ICD-10-CM | POA: Diagnosis not present

## 2023-01-09 DIAGNOSIS — I82402 Acute embolism and thrombosis of unspecified deep veins of left lower extremity: Secondary | ICD-10-CM | POA: Diagnosis not present

## 2023-01-09 DIAGNOSIS — E785 Hyperlipidemia, unspecified: Secondary | ICD-10-CM | POA: Diagnosis not present

## 2023-01-09 DIAGNOSIS — F0390 Unspecified dementia without behavioral disturbance: Secondary | ICD-10-CM | POA: Diagnosis not present

## 2023-01-09 DIAGNOSIS — I2699 Other pulmonary embolism without acute cor pulmonale: Secondary | ICD-10-CM | POA: Diagnosis not present

## 2023-01-09 DIAGNOSIS — C541 Malignant neoplasm of endometrium: Secondary | ICD-10-CM | POA: Diagnosis not present

## 2023-01-16 DIAGNOSIS — C541 Malignant neoplasm of endometrium: Secondary | ICD-10-CM | POA: Diagnosis not present

## 2023-01-16 DIAGNOSIS — F0390 Unspecified dementia without behavioral disturbance: Secondary | ICD-10-CM | POA: Diagnosis not present

## 2023-01-16 DIAGNOSIS — E785 Hyperlipidemia, unspecified: Secondary | ICD-10-CM | POA: Diagnosis not present

## 2023-01-16 DIAGNOSIS — I82402 Acute embolism and thrombosis of unspecified deep veins of left lower extremity: Secondary | ICD-10-CM | POA: Diagnosis not present

## 2023-01-16 DIAGNOSIS — I10 Essential (primary) hypertension: Secondary | ICD-10-CM | POA: Diagnosis not present

## 2023-01-16 DIAGNOSIS — I2699 Other pulmonary embolism without acute cor pulmonale: Secondary | ICD-10-CM | POA: Diagnosis not present

## 2023-01-23 DIAGNOSIS — I2699 Other pulmonary embolism without acute cor pulmonale: Secondary | ICD-10-CM | POA: Diagnosis not present

## 2023-01-23 DIAGNOSIS — F0390 Unspecified dementia without behavioral disturbance: Secondary | ICD-10-CM | POA: Diagnosis not present

## 2023-01-23 DIAGNOSIS — E785 Hyperlipidemia, unspecified: Secondary | ICD-10-CM | POA: Diagnosis not present

## 2023-01-23 DIAGNOSIS — I82402 Acute embolism and thrombosis of unspecified deep veins of left lower extremity: Secondary | ICD-10-CM | POA: Diagnosis not present

## 2023-01-23 DIAGNOSIS — C541 Malignant neoplasm of endometrium: Secondary | ICD-10-CM | POA: Diagnosis not present

## 2023-01-23 DIAGNOSIS — I10 Essential (primary) hypertension: Secondary | ICD-10-CM | POA: Diagnosis not present

## 2023-01-30 DIAGNOSIS — C541 Malignant neoplasm of endometrium: Secondary | ICD-10-CM | POA: Diagnosis not present

## 2023-01-30 DIAGNOSIS — I2699 Other pulmonary embolism without acute cor pulmonale: Secondary | ICD-10-CM | POA: Diagnosis not present

## 2023-01-30 DIAGNOSIS — I10 Essential (primary) hypertension: Secondary | ICD-10-CM | POA: Diagnosis not present

## 2023-01-30 DIAGNOSIS — E785 Hyperlipidemia, unspecified: Secondary | ICD-10-CM | POA: Diagnosis not present

## 2023-01-30 DIAGNOSIS — F0390 Unspecified dementia without behavioral disturbance: Secondary | ICD-10-CM | POA: Diagnosis not present

## 2023-01-30 DIAGNOSIS — I82402 Acute embolism and thrombosis of unspecified deep veins of left lower extremity: Secondary | ICD-10-CM | POA: Diagnosis not present

## 2023-02-04 DIAGNOSIS — C541 Malignant neoplasm of endometrium: Secondary | ICD-10-CM | POA: Diagnosis not present

## 2023-02-04 DIAGNOSIS — E785 Hyperlipidemia, unspecified: Secondary | ICD-10-CM | POA: Diagnosis not present

## 2023-02-04 DIAGNOSIS — L409 Psoriasis, unspecified: Secondary | ICD-10-CM | POA: Diagnosis not present

## 2023-02-04 DIAGNOSIS — F0390 Unspecified dementia without behavioral disturbance: Secondary | ICD-10-CM | POA: Diagnosis not present

## 2023-02-04 DIAGNOSIS — I10 Essential (primary) hypertension: Secondary | ICD-10-CM | POA: Diagnosis not present

## 2023-02-04 DIAGNOSIS — I82402 Acute embolism and thrombosis of unspecified deep veins of left lower extremity: Secondary | ICD-10-CM | POA: Diagnosis not present

## 2023-02-04 DIAGNOSIS — I2699 Other pulmonary embolism without acute cor pulmonale: Secondary | ICD-10-CM | POA: Diagnosis not present

## 2023-02-06 DIAGNOSIS — E785 Hyperlipidemia, unspecified: Secondary | ICD-10-CM | POA: Diagnosis not present

## 2023-02-06 DIAGNOSIS — I10 Essential (primary) hypertension: Secondary | ICD-10-CM | POA: Diagnosis not present

## 2023-02-06 DIAGNOSIS — F0390 Unspecified dementia without behavioral disturbance: Secondary | ICD-10-CM | POA: Diagnosis not present

## 2023-02-06 DIAGNOSIS — C541 Malignant neoplasm of endometrium: Secondary | ICD-10-CM | POA: Diagnosis not present

## 2023-02-06 DIAGNOSIS — I82402 Acute embolism and thrombosis of unspecified deep veins of left lower extremity: Secondary | ICD-10-CM | POA: Diagnosis not present

## 2023-02-06 DIAGNOSIS — I2699 Other pulmonary embolism without acute cor pulmonale: Secondary | ICD-10-CM | POA: Diagnosis not present

## 2023-02-13 DIAGNOSIS — I82402 Acute embolism and thrombosis of unspecified deep veins of left lower extremity: Secondary | ICD-10-CM | POA: Diagnosis not present

## 2023-02-13 DIAGNOSIS — I2699 Other pulmonary embolism without acute cor pulmonale: Secondary | ICD-10-CM | POA: Diagnosis not present

## 2023-02-13 DIAGNOSIS — F0390 Unspecified dementia without behavioral disturbance: Secondary | ICD-10-CM | POA: Diagnosis not present

## 2023-02-13 DIAGNOSIS — C541 Malignant neoplasm of endometrium: Secondary | ICD-10-CM | POA: Diagnosis not present

## 2023-02-13 DIAGNOSIS — I10 Essential (primary) hypertension: Secondary | ICD-10-CM | POA: Diagnosis not present

## 2023-02-13 DIAGNOSIS — E785 Hyperlipidemia, unspecified: Secondary | ICD-10-CM | POA: Diagnosis not present

## 2023-02-20 DIAGNOSIS — I10 Essential (primary) hypertension: Secondary | ICD-10-CM | POA: Diagnosis not present

## 2023-02-20 DIAGNOSIS — F0390 Unspecified dementia without behavioral disturbance: Secondary | ICD-10-CM | POA: Diagnosis not present

## 2023-02-20 DIAGNOSIS — I2699 Other pulmonary embolism without acute cor pulmonale: Secondary | ICD-10-CM | POA: Diagnosis not present

## 2023-02-20 DIAGNOSIS — E785 Hyperlipidemia, unspecified: Secondary | ICD-10-CM | POA: Diagnosis not present

## 2023-02-20 DIAGNOSIS — C541 Malignant neoplasm of endometrium: Secondary | ICD-10-CM | POA: Diagnosis not present

## 2023-02-20 DIAGNOSIS — I82402 Acute embolism and thrombosis of unspecified deep veins of left lower extremity: Secondary | ICD-10-CM | POA: Diagnosis not present

## 2023-02-26 DIAGNOSIS — C541 Malignant neoplasm of endometrium: Secondary | ICD-10-CM | POA: Diagnosis not present

## 2023-02-26 DIAGNOSIS — I10 Essential (primary) hypertension: Secondary | ICD-10-CM | POA: Diagnosis not present

## 2023-02-26 DIAGNOSIS — I2699 Other pulmonary embolism without acute cor pulmonale: Secondary | ICD-10-CM | POA: Diagnosis not present

## 2023-02-26 DIAGNOSIS — F0390 Unspecified dementia without behavioral disturbance: Secondary | ICD-10-CM | POA: Diagnosis not present

## 2023-02-26 DIAGNOSIS — E785 Hyperlipidemia, unspecified: Secondary | ICD-10-CM | POA: Diagnosis not present

## 2023-02-26 DIAGNOSIS — I82402 Acute embolism and thrombosis of unspecified deep veins of left lower extremity: Secondary | ICD-10-CM | POA: Diagnosis not present

## 2023-02-27 DIAGNOSIS — C541 Malignant neoplasm of endometrium: Secondary | ICD-10-CM | POA: Diagnosis not present

## 2023-02-27 DIAGNOSIS — I10 Essential (primary) hypertension: Secondary | ICD-10-CM | POA: Diagnosis not present

## 2023-02-27 DIAGNOSIS — I82402 Acute embolism and thrombosis of unspecified deep veins of left lower extremity: Secondary | ICD-10-CM | POA: Diagnosis not present

## 2023-02-27 DIAGNOSIS — I2699 Other pulmonary embolism without acute cor pulmonale: Secondary | ICD-10-CM | POA: Diagnosis not present

## 2023-02-27 DIAGNOSIS — F0390 Unspecified dementia without behavioral disturbance: Secondary | ICD-10-CM | POA: Diagnosis not present

## 2023-02-27 DIAGNOSIS — E785 Hyperlipidemia, unspecified: Secondary | ICD-10-CM | POA: Diagnosis not present

## 2023-02-28 DIAGNOSIS — C541 Malignant neoplasm of endometrium: Secondary | ICD-10-CM | POA: Diagnosis not present

## 2023-02-28 DIAGNOSIS — I10 Essential (primary) hypertension: Secondary | ICD-10-CM | POA: Diagnosis not present

## 2023-02-28 DIAGNOSIS — I2699 Other pulmonary embolism without acute cor pulmonale: Secondary | ICD-10-CM | POA: Diagnosis not present

## 2023-02-28 DIAGNOSIS — E785 Hyperlipidemia, unspecified: Secondary | ICD-10-CM | POA: Diagnosis not present

## 2023-02-28 DIAGNOSIS — I82402 Acute embolism and thrombosis of unspecified deep veins of left lower extremity: Secondary | ICD-10-CM | POA: Diagnosis not present

## 2023-02-28 DIAGNOSIS — F0390 Unspecified dementia without behavioral disturbance: Secondary | ICD-10-CM | POA: Diagnosis not present

## 2023-03-04 DIAGNOSIS — I82402 Acute embolism and thrombosis of unspecified deep veins of left lower extremity: Secondary | ICD-10-CM | POA: Diagnosis not present

## 2023-03-04 DIAGNOSIS — C541 Malignant neoplasm of endometrium: Secondary | ICD-10-CM | POA: Diagnosis not present

## 2023-03-04 DIAGNOSIS — I10 Essential (primary) hypertension: Secondary | ICD-10-CM | POA: Diagnosis not present

## 2023-03-04 DIAGNOSIS — E785 Hyperlipidemia, unspecified: Secondary | ICD-10-CM | POA: Diagnosis not present

## 2023-03-04 DIAGNOSIS — I2699 Other pulmonary embolism without acute cor pulmonale: Secondary | ICD-10-CM | POA: Diagnosis not present

## 2023-03-04 DIAGNOSIS — F0390 Unspecified dementia without behavioral disturbance: Secondary | ICD-10-CM | POA: Diagnosis not present

## 2023-03-05 DIAGNOSIS — E785 Hyperlipidemia, unspecified: Secondary | ICD-10-CM | POA: Diagnosis not present

## 2023-03-05 DIAGNOSIS — I10 Essential (primary) hypertension: Secondary | ICD-10-CM | POA: Diagnosis not present

## 2023-03-05 DIAGNOSIS — F0390 Unspecified dementia without behavioral disturbance: Secondary | ICD-10-CM | POA: Diagnosis not present

## 2023-03-05 DIAGNOSIS — I82402 Acute embolism and thrombosis of unspecified deep veins of left lower extremity: Secondary | ICD-10-CM | POA: Diagnosis not present

## 2023-03-05 DIAGNOSIS — I2699 Other pulmonary embolism without acute cor pulmonale: Secondary | ICD-10-CM | POA: Diagnosis not present

## 2023-03-05 DIAGNOSIS — C541 Malignant neoplasm of endometrium: Secondary | ICD-10-CM | POA: Diagnosis not present

## 2023-03-06 DIAGNOSIS — I10 Essential (primary) hypertension: Secondary | ICD-10-CM | POA: Diagnosis not present

## 2023-03-06 DIAGNOSIS — I82402 Acute embolism and thrombosis of unspecified deep veins of left lower extremity: Secondary | ICD-10-CM | POA: Diagnosis not present

## 2023-03-06 DIAGNOSIS — I2699 Other pulmonary embolism without acute cor pulmonale: Secondary | ICD-10-CM | POA: Diagnosis not present

## 2023-03-06 DIAGNOSIS — F0390 Unspecified dementia without behavioral disturbance: Secondary | ICD-10-CM | POA: Diagnosis not present

## 2023-03-06 DIAGNOSIS — C541 Malignant neoplasm of endometrium: Secondary | ICD-10-CM | POA: Diagnosis not present

## 2023-03-06 DIAGNOSIS — E785 Hyperlipidemia, unspecified: Secondary | ICD-10-CM | POA: Diagnosis not present

## 2023-03-07 DIAGNOSIS — L409 Psoriasis, unspecified: Secondary | ICD-10-CM | POA: Diagnosis not present

## 2023-03-07 DIAGNOSIS — E785 Hyperlipidemia, unspecified: Secondary | ICD-10-CM | POA: Diagnosis not present

## 2023-03-07 DIAGNOSIS — F0390 Unspecified dementia without behavioral disturbance: Secondary | ICD-10-CM | POA: Diagnosis not present

## 2023-03-07 DIAGNOSIS — I82402 Acute embolism and thrombosis of unspecified deep veins of left lower extremity: Secondary | ICD-10-CM | POA: Diagnosis not present

## 2023-03-07 DIAGNOSIS — C541 Malignant neoplasm of endometrium: Secondary | ICD-10-CM | POA: Diagnosis not present

## 2023-03-07 DIAGNOSIS — I10 Essential (primary) hypertension: Secondary | ICD-10-CM | POA: Diagnosis not present

## 2023-03-07 DIAGNOSIS — I2699 Other pulmonary embolism without acute cor pulmonale: Secondary | ICD-10-CM | POA: Diagnosis not present

## 2023-03-08 DIAGNOSIS — C541 Malignant neoplasm of endometrium: Secondary | ICD-10-CM | POA: Diagnosis not present

## 2023-03-08 DIAGNOSIS — I10 Essential (primary) hypertension: Secondary | ICD-10-CM | POA: Diagnosis not present

## 2023-03-08 DIAGNOSIS — E785 Hyperlipidemia, unspecified: Secondary | ICD-10-CM | POA: Diagnosis not present

## 2023-03-08 DIAGNOSIS — I82402 Acute embolism and thrombosis of unspecified deep veins of left lower extremity: Secondary | ICD-10-CM | POA: Diagnosis not present

## 2023-03-08 DIAGNOSIS — I2699 Other pulmonary embolism without acute cor pulmonale: Secondary | ICD-10-CM | POA: Diagnosis not present

## 2023-03-08 DIAGNOSIS — F0390 Unspecified dementia without behavioral disturbance: Secondary | ICD-10-CM | POA: Diagnosis not present

## 2023-03-11 DIAGNOSIS — I82402 Acute embolism and thrombosis of unspecified deep veins of left lower extremity: Secondary | ICD-10-CM | POA: Diagnosis not present

## 2023-03-11 DIAGNOSIS — I2699 Other pulmonary embolism without acute cor pulmonale: Secondary | ICD-10-CM | POA: Diagnosis not present

## 2023-03-11 DIAGNOSIS — C541 Malignant neoplasm of endometrium: Secondary | ICD-10-CM | POA: Diagnosis not present

## 2023-03-11 DIAGNOSIS — I10 Essential (primary) hypertension: Secondary | ICD-10-CM | POA: Diagnosis not present

## 2023-03-11 DIAGNOSIS — E785 Hyperlipidemia, unspecified: Secondary | ICD-10-CM | POA: Diagnosis not present

## 2023-03-11 DIAGNOSIS — F0390 Unspecified dementia without behavioral disturbance: Secondary | ICD-10-CM | POA: Diagnosis not present

## 2023-03-12 DIAGNOSIS — I10 Essential (primary) hypertension: Secondary | ICD-10-CM | POA: Diagnosis not present

## 2023-03-12 DIAGNOSIS — I82402 Acute embolism and thrombosis of unspecified deep veins of left lower extremity: Secondary | ICD-10-CM | POA: Diagnosis not present

## 2023-03-12 DIAGNOSIS — C541 Malignant neoplasm of endometrium: Secondary | ICD-10-CM | POA: Diagnosis not present

## 2023-03-12 DIAGNOSIS — E785 Hyperlipidemia, unspecified: Secondary | ICD-10-CM | POA: Diagnosis not present

## 2023-03-12 DIAGNOSIS — F0390 Unspecified dementia without behavioral disturbance: Secondary | ICD-10-CM | POA: Diagnosis not present

## 2023-03-12 DIAGNOSIS — I2699 Other pulmonary embolism without acute cor pulmonale: Secondary | ICD-10-CM | POA: Diagnosis not present

## 2023-03-15 DIAGNOSIS — I2699 Other pulmonary embolism without acute cor pulmonale: Secondary | ICD-10-CM | POA: Diagnosis not present

## 2023-03-15 DIAGNOSIS — I10 Essential (primary) hypertension: Secondary | ICD-10-CM | POA: Diagnosis not present

## 2023-03-15 DIAGNOSIS — F0390 Unspecified dementia without behavioral disturbance: Secondary | ICD-10-CM | POA: Diagnosis not present

## 2023-03-15 DIAGNOSIS — I82402 Acute embolism and thrombosis of unspecified deep veins of left lower extremity: Secondary | ICD-10-CM | POA: Diagnosis not present

## 2023-03-15 DIAGNOSIS — C541 Malignant neoplasm of endometrium: Secondary | ICD-10-CM | POA: Diagnosis not present

## 2023-03-15 DIAGNOSIS — E785 Hyperlipidemia, unspecified: Secondary | ICD-10-CM | POA: Diagnosis not present

## 2023-03-18 DIAGNOSIS — F0390 Unspecified dementia without behavioral disturbance: Secondary | ICD-10-CM | POA: Diagnosis not present

## 2023-03-18 DIAGNOSIS — I2699 Other pulmonary embolism without acute cor pulmonale: Secondary | ICD-10-CM | POA: Diagnosis not present

## 2023-03-18 DIAGNOSIS — C541 Malignant neoplasm of endometrium: Secondary | ICD-10-CM | POA: Diagnosis not present

## 2023-03-18 DIAGNOSIS — E785 Hyperlipidemia, unspecified: Secondary | ICD-10-CM | POA: Diagnosis not present

## 2023-03-18 DIAGNOSIS — I82402 Acute embolism and thrombosis of unspecified deep veins of left lower extremity: Secondary | ICD-10-CM | POA: Diagnosis not present

## 2023-03-18 DIAGNOSIS — I10 Essential (primary) hypertension: Secondary | ICD-10-CM | POA: Diagnosis not present

## 2023-03-19 DIAGNOSIS — I82402 Acute embolism and thrombosis of unspecified deep veins of left lower extremity: Secondary | ICD-10-CM | POA: Diagnosis not present

## 2023-03-19 DIAGNOSIS — I2699 Other pulmonary embolism without acute cor pulmonale: Secondary | ICD-10-CM | POA: Diagnosis not present

## 2023-03-19 DIAGNOSIS — I10 Essential (primary) hypertension: Secondary | ICD-10-CM | POA: Diagnosis not present

## 2023-03-19 DIAGNOSIS — C541 Malignant neoplasm of endometrium: Secondary | ICD-10-CM | POA: Diagnosis not present

## 2023-03-19 DIAGNOSIS — E785 Hyperlipidemia, unspecified: Secondary | ICD-10-CM | POA: Diagnosis not present

## 2023-03-19 DIAGNOSIS — F0390 Unspecified dementia without behavioral disturbance: Secondary | ICD-10-CM | POA: Diagnosis not present

## 2023-03-21 DIAGNOSIS — I2699 Other pulmonary embolism without acute cor pulmonale: Secondary | ICD-10-CM | POA: Diagnosis not present

## 2023-03-21 DIAGNOSIS — F0390 Unspecified dementia without behavioral disturbance: Secondary | ICD-10-CM | POA: Diagnosis not present

## 2023-03-21 DIAGNOSIS — I82402 Acute embolism and thrombosis of unspecified deep veins of left lower extremity: Secondary | ICD-10-CM | POA: Diagnosis not present

## 2023-03-21 DIAGNOSIS — E785 Hyperlipidemia, unspecified: Secondary | ICD-10-CM | POA: Diagnosis not present

## 2023-03-21 DIAGNOSIS — C541 Malignant neoplasm of endometrium: Secondary | ICD-10-CM | POA: Diagnosis not present

## 2023-03-21 DIAGNOSIS — I10 Essential (primary) hypertension: Secondary | ICD-10-CM | POA: Diagnosis not present

## 2023-03-25 DIAGNOSIS — I2699 Other pulmonary embolism without acute cor pulmonale: Secondary | ICD-10-CM | POA: Diagnosis not present

## 2023-03-25 DIAGNOSIS — I10 Essential (primary) hypertension: Secondary | ICD-10-CM | POA: Diagnosis not present

## 2023-03-25 DIAGNOSIS — F0390 Unspecified dementia without behavioral disturbance: Secondary | ICD-10-CM | POA: Diagnosis not present

## 2023-03-25 DIAGNOSIS — I82402 Acute embolism and thrombosis of unspecified deep veins of left lower extremity: Secondary | ICD-10-CM | POA: Diagnosis not present

## 2023-03-25 DIAGNOSIS — E785 Hyperlipidemia, unspecified: Secondary | ICD-10-CM | POA: Diagnosis not present

## 2023-03-25 DIAGNOSIS — C541 Malignant neoplasm of endometrium: Secondary | ICD-10-CM | POA: Diagnosis not present

## 2023-03-26 DIAGNOSIS — F0390 Unspecified dementia without behavioral disturbance: Secondary | ICD-10-CM | POA: Diagnosis not present

## 2023-03-26 DIAGNOSIS — I10 Essential (primary) hypertension: Secondary | ICD-10-CM | POA: Diagnosis not present

## 2023-03-26 DIAGNOSIS — I2699 Other pulmonary embolism without acute cor pulmonale: Secondary | ICD-10-CM | POA: Diagnosis not present

## 2023-03-26 DIAGNOSIS — E785 Hyperlipidemia, unspecified: Secondary | ICD-10-CM | POA: Diagnosis not present

## 2023-03-26 DIAGNOSIS — C541 Malignant neoplasm of endometrium: Secondary | ICD-10-CM | POA: Diagnosis not present

## 2023-03-26 DIAGNOSIS — I82402 Acute embolism and thrombosis of unspecified deep veins of left lower extremity: Secondary | ICD-10-CM | POA: Diagnosis not present

## 2023-03-28 DIAGNOSIS — I82402 Acute embolism and thrombosis of unspecified deep veins of left lower extremity: Secondary | ICD-10-CM | POA: Diagnosis not present

## 2023-03-28 DIAGNOSIS — F0390 Unspecified dementia without behavioral disturbance: Secondary | ICD-10-CM | POA: Diagnosis not present

## 2023-03-28 DIAGNOSIS — E785 Hyperlipidemia, unspecified: Secondary | ICD-10-CM | POA: Diagnosis not present

## 2023-03-28 DIAGNOSIS — I2699 Other pulmonary embolism without acute cor pulmonale: Secondary | ICD-10-CM | POA: Diagnosis not present

## 2023-03-28 DIAGNOSIS — C541 Malignant neoplasm of endometrium: Secondary | ICD-10-CM | POA: Diagnosis not present

## 2023-03-28 DIAGNOSIS — I10 Essential (primary) hypertension: Secondary | ICD-10-CM | POA: Diagnosis not present

## 2023-04-01 DIAGNOSIS — I2699 Other pulmonary embolism without acute cor pulmonale: Secondary | ICD-10-CM | POA: Diagnosis not present

## 2023-04-01 DIAGNOSIS — I10 Essential (primary) hypertension: Secondary | ICD-10-CM | POA: Diagnosis not present

## 2023-04-01 DIAGNOSIS — I82402 Acute embolism and thrombosis of unspecified deep veins of left lower extremity: Secondary | ICD-10-CM | POA: Diagnosis not present

## 2023-04-01 DIAGNOSIS — E785 Hyperlipidemia, unspecified: Secondary | ICD-10-CM | POA: Diagnosis not present

## 2023-04-01 DIAGNOSIS — F0390 Unspecified dementia without behavioral disturbance: Secondary | ICD-10-CM | POA: Diagnosis not present

## 2023-04-01 DIAGNOSIS — C541 Malignant neoplasm of endometrium: Secondary | ICD-10-CM | POA: Diagnosis not present

## 2023-04-02 DIAGNOSIS — I2699 Other pulmonary embolism without acute cor pulmonale: Secondary | ICD-10-CM | POA: Diagnosis not present

## 2023-04-02 DIAGNOSIS — E785 Hyperlipidemia, unspecified: Secondary | ICD-10-CM | POA: Diagnosis not present

## 2023-04-02 DIAGNOSIS — F0390 Unspecified dementia without behavioral disturbance: Secondary | ICD-10-CM | POA: Diagnosis not present

## 2023-04-02 DIAGNOSIS — I82402 Acute embolism and thrombosis of unspecified deep veins of left lower extremity: Secondary | ICD-10-CM | POA: Diagnosis not present

## 2023-04-02 DIAGNOSIS — I10 Essential (primary) hypertension: Secondary | ICD-10-CM | POA: Diagnosis not present

## 2023-04-02 DIAGNOSIS — C541 Malignant neoplasm of endometrium: Secondary | ICD-10-CM | POA: Diagnosis not present

## 2023-04-04 DIAGNOSIS — C541 Malignant neoplasm of endometrium: Secondary | ICD-10-CM | POA: Diagnosis not present

## 2023-04-04 DIAGNOSIS — I82402 Acute embolism and thrombosis of unspecified deep veins of left lower extremity: Secondary | ICD-10-CM | POA: Diagnosis not present

## 2023-04-04 DIAGNOSIS — I2699 Other pulmonary embolism without acute cor pulmonale: Secondary | ICD-10-CM | POA: Diagnosis not present

## 2023-04-04 DIAGNOSIS — E785 Hyperlipidemia, unspecified: Secondary | ICD-10-CM | POA: Diagnosis not present

## 2023-04-04 DIAGNOSIS — I10 Essential (primary) hypertension: Secondary | ICD-10-CM | POA: Diagnosis not present

## 2023-04-04 DIAGNOSIS — F0390 Unspecified dementia without behavioral disturbance: Secondary | ICD-10-CM | POA: Diagnosis not present

## 2023-04-07 DIAGNOSIS — I82402 Acute embolism and thrombosis of unspecified deep veins of left lower extremity: Secondary | ICD-10-CM | POA: Diagnosis not present

## 2023-04-07 DIAGNOSIS — I2699 Other pulmonary embolism without acute cor pulmonale: Secondary | ICD-10-CM | POA: Diagnosis not present

## 2023-04-07 DIAGNOSIS — I10 Essential (primary) hypertension: Secondary | ICD-10-CM | POA: Diagnosis not present

## 2023-04-07 DIAGNOSIS — C541 Malignant neoplasm of endometrium: Secondary | ICD-10-CM | POA: Diagnosis not present

## 2023-04-07 DIAGNOSIS — F0390 Unspecified dementia without behavioral disturbance: Secondary | ICD-10-CM | POA: Diagnosis not present

## 2023-04-07 DIAGNOSIS — E785 Hyperlipidemia, unspecified: Secondary | ICD-10-CM | POA: Diagnosis not present

## 2023-04-07 DIAGNOSIS — L409 Psoriasis, unspecified: Secondary | ICD-10-CM | POA: Diagnosis not present

## 2023-04-09 DIAGNOSIS — E785 Hyperlipidemia, unspecified: Secondary | ICD-10-CM | POA: Diagnosis not present

## 2023-04-09 DIAGNOSIS — I82402 Acute embolism and thrombosis of unspecified deep veins of left lower extremity: Secondary | ICD-10-CM | POA: Diagnosis not present

## 2023-04-09 DIAGNOSIS — F0390 Unspecified dementia without behavioral disturbance: Secondary | ICD-10-CM | POA: Diagnosis not present

## 2023-04-09 DIAGNOSIS — C541 Malignant neoplasm of endometrium: Secondary | ICD-10-CM | POA: Diagnosis not present

## 2023-04-09 DIAGNOSIS — I2699 Other pulmonary embolism without acute cor pulmonale: Secondary | ICD-10-CM | POA: Diagnosis not present

## 2023-04-09 DIAGNOSIS — I10 Essential (primary) hypertension: Secondary | ICD-10-CM | POA: Diagnosis not present

## 2023-04-11 DIAGNOSIS — I82402 Acute embolism and thrombosis of unspecified deep veins of left lower extremity: Secondary | ICD-10-CM | POA: Diagnosis not present

## 2023-04-11 DIAGNOSIS — F0390 Unspecified dementia without behavioral disturbance: Secondary | ICD-10-CM | POA: Diagnosis not present

## 2023-04-11 DIAGNOSIS — I2699 Other pulmonary embolism without acute cor pulmonale: Secondary | ICD-10-CM | POA: Diagnosis not present

## 2023-04-11 DIAGNOSIS — I10 Essential (primary) hypertension: Secondary | ICD-10-CM | POA: Diagnosis not present

## 2023-04-11 DIAGNOSIS — C541 Malignant neoplasm of endometrium: Secondary | ICD-10-CM | POA: Diagnosis not present

## 2023-04-11 DIAGNOSIS — E785 Hyperlipidemia, unspecified: Secondary | ICD-10-CM | POA: Diagnosis not present

## 2023-04-12 DIAGNOSIS — C541 Malignant neoplasm of endometrium: Secondary | ICD-10-CM | POA: Diagnosis not present

## 2023-04-12 DIAGNOSIS — I82402 Acute embolism and thrombosis of unspecified deep veins of left lower extremity: Secondary | ICD-10-CM | POA: Diagnosis not present

## 2023-04-12 DIAGNOSIS — E785 Hyperlipidemia, unspecified: Secondary | ICD-10-CM | POA: Diagnosis not present

## 2023-04-12 DIAGNOSIS — I10 Essential (primary) hypertension: Secondary | ICD-10-CM | POA: Diagnosis not present

## 2023-04-12 DIAGNOSIS — F0390 Unspecified dementia without behavioral disturbance: Secondary | ICD-10-CM | POA: Diagnosis not present

## 2023-04-12 DIAGNOSIS — I2699 Other pulmonary embolism without acute cor pulmonale: Secondary | ICD-10-CM | POA: Diagnosis not present

## 2023-04-15 DIAGNOSIS — E785 Hyperlipidemia, unspecified: Secondary | ICD-10-CM | POA: Diagnosis not present

## 2023-04-15 DIAGNOSIS — F0390 Unspecified dementia without behavioral disturbance: Secondary | ICD-10-CM | POA: Diagnosis not present

## 2023-04-15 DIAGNOSIS — I2699 Other pulmonary embolism without acute cor pulmonale: Secondary | ICD-10-CM | POA: Diagnosis not present

## 2023-04-15 DIAGNOSIS — C541 Malignant neoplasm of endometrium: Secondary | ICD-10-CM | POA: Diagnosis not present

## 2023-04-15 DIAGNOSIS — I82402 Acute embolism and thrombosis of unspecified deep veins of left lower extremity: Secondary | ICD-10-CM | POA: Diagnosis not present

## 2023-04-15 DIAGNOSIS — I10 Essential (primary) hypertension: Secondary | ICD-10-CM | POA: Diagnosis not present

## 2023-04-16 DIAGNOSIS — E785 Hyperlipidemia, unspecified: Secondary | ICD-10-CM | POA: Diagnosis not present

## 2023-04-16 DIAGNOSIS — I2699 Other pulmonary embolism without acute cor pulmonale: Secondary | ICD-10-CM | POA: Diagnosis not present

## 2023-04-16 DIAGNOSIS — I10 Essential (primary) hypertension: Secondary | ICD-10-CM | POA: Diagnosis not present

## 2023-04-16 DIAGNOSIS — I82402 Acute embolism and thrombosis of unspecified deep veins of left lower extremity: Secondary | ICD-10-CM | POA: Diagnosis not present

## 2023-04-16 DIAGNOSIS — C541 Malignant neoplasm of endometrium: Secondary | ICD-10-CM | POA: Diagnosis not present

## 2023-04-16 DIAGNOSIS — F0390 Unspecified dementia without behavioral disturbance: Secondary | ICD-10-CM | POA: Diagnosis not present

## 2023-04-18 DIAGNOSIS — I2699 Other pulmonary embolism without acute cor pulmonale: Secondary | ICD-10-CM | POA: Diagnosis not present

## 2023-04-18 DIAGNOSIS — C541 Malignant neoplasm of endometrium: Secondary | ICD-10-CM | POA: Diagnosis not present

## 2023-04-18 DIAGNOSIS — F0390 Unspecified dementia without behavioral disturbance: Secondary | ICD-10-CM | POA: Diagnosis not present

## 2023-04-18 DIAGNOSIS — I10 Essential (primary) hypertension: Secondary | ICD-10-CM | POA: Diagnosis not present

## 2023-04-18 DIAGNOSIS — I82402 Acute embolism and thrombosis of unspecified deep veins of left lower extremity: Secondary | ICD-10-CM | POA: Diagnosis not present

## 2023-04-18 DIAGNOSIS — E785 Hyperlipidemia, unspecified: Secondary | ICD-10-CM | POA: Diagnosis not present

## 2023-04-19 DIAGNOSIS — I82402 Acute embolism and thrombosis of unspecified deep veins of left lower extremity: Secondary | ICD-10-CM | POA: Diagnosis not present

## 2023-04-19 DIAGNOSIS — F0390 Unspecified dementia without behavioral disturbance: Secondary | ICD-10-CM | POA: Diagnosis not present

## 2023-04-19 DIAGNOSIS — C541 Malignant neoplasm of endometrium: Secondary | ICD-10-CM | POA: Diagnosis not present

## 2023-04-19 DIAGNOSIS — I2699 Other pulmonary embolism without acute cor pulmonale: Secondary | ICD-10-CM | POA: Diagnosis not present

## 2023-04-19 DIAGNOSIS — E785 Hyperlipidemia, unspecified: Secondary | ICD-10-CM | POA: Diagnosis not present

## 2023-04-19 DIAGNOSIS — I10 Essential (primary) hypertension: Secondary | ICD-10-CM | POA: Diagnosis not present

## 2023-04-22 DIAGNOSIS — I82402 Acute embolism and thrombosis of unspecified deep veins of left lower extremity: Secondary | ICD-10-CM | POA: Diagnosis not present

## 2023-04-22 DIAGNOSIS — F0390 Unspecified dementia without behavioral disturbance: Secondary | ICD-10-CM | POA: Diagnosis not present

## 2023-04-22 DIAGNOSIS — C541 Malignant neoplasm of endometrium: Secondary | ICD-10-CM | POA: Diagnosis not present

## 2023-04-22 DIAGNOSIS — E785 Hyperlipidemia, unspecified: Secondary | ICD-10-CM | POA: Diagnosis not present

## 2023-04-22 DIAGNOSIS — I2699 Other pulmonary embolism without acute cor pulmonale: Secondary | ICD-10-CM | POA: Diagnosis not present

## 2023-04-22 DIAGNOSIS — I10 Essential (primary) hypertension: Secondary | ICD-10-CM | POA: Diagnosis not present

## 2023-04-23 DIAGNOSIS — I10 Essential (primary) hypertension: Secondary | ICD-10-CM | POA: Diagnosis not present

## 2023-04-23 DIAGNOSIS — F0390 Unspecified dementia without behavioral disturbance: Secondary | ICD-10-CM | POA: Diagnosis not present

## 2023-04-23 DIAGNOSIS — I82402 Acute embolism and thrombosis of unspecified deep veins of left lower extremity: Secondary | ICD-10-CM | POA: Diagnosis not present

## 2023-04-23 DIAGNOSIS — C541 Malignant neoplasm of endometrium: Secondary | ICD-10-CM | POA: Diagnosis not present

## 2023-04-23 DIAGNOSIS — I2699 Other pulmonary embolism without acute cor pulmonale: Secondary | ICD-10-CM | POA: Diagnosis not present

## 2023-04-23 DIAGNOSIS — E785 Hyperlipidemia, unspecified: Secondary | ICD-10-CM | POA: Diagnosis not present

## 2023-04-25 DIAGNOSIS — C541 Malignant neoplasm of endometrium: Secondary | ICD-10-CM | POA: Diagnosis not present

## 2023-04-25 DIAGNOSIS — I82402 Acute embolism and thrombosis of unspecified deep veins of left lower extremity: Secondary | ICD-10-CM | POA: Diagnosis not present

## 2023-04-25 DIAGNOSIS — F0390 Unspecified dementia without behavioral disturbance: Secondary | ICD-10-CM | POA: Diagnosis not present

## 2023-04-25 DIAGNOSIS — E785 Hyperlipidemia, unspecified: Secondary | ICD-10-CM | POA: Diagnosis not present

## 2023-04-25 DIAGNOSIS — I2699 Other pulmonary embolism without acute cor pulmonale: Secondary | ICD-10-CM | POA: Diagnosis not present

## 2023-04-25 DIAGNOSIS — I10 Essential (primary) hypertension: Secondary | ICD-10-CM | POA: Diagnosis not present

## 2023-04-26 DIAGNOSIS — C541 Malignant neoplasm of endometrium: Secondary | ICD-10-CM | POA: Diagnosis not present

## 2023-04-26 DIAGNOSIS — E785 Hyperlipidemia, unspecified: Secondary | ICD-10-CM | POA: Diagnosis not present

## 2023-04-26 DIAGNOSIS — I82402 Acute embolism and thrombosis of unspecified deep veins of left lower extremity: Secondary | ICD-10-CM | POA: Diagnosis not present

## 2023-04-26 DIAGNOSIS — I10 Essential (primary) hypertension: Secondary | ICD-10-CM | POA: Diagnosis not present

## 2023-04-26 DIAGNOSIS — F0390 Unspecified dementia without behavioral disturbance: Secondary | ICD-10-CM | POA: Diagnosis not present

## 2023-04-26 DIAGNOSIS — I2699 Other pulmonary embolism without acute cor pulmonale: Secondary | ICD-10-CM | POA: Diagnosis not present

## 2023-04-29 DIAGNOSIS — E785 Hyperlipidemia, unspecified: Secondary | ICD-10-CM | POA: Diagnosis not present

## 2023-04-29 DIAGNOSIS — I82402 Acute embolism and thrombosis of unspecified deep veins of left lower extremity: Secondary | ICD-10-CM | POA: Diagnosis not present

## 2023-04-29 DIAGNOSIS — I10 Essential (primary) hypertension: Secondary | ICD-10-CM | POA: Diagnosis not present

## 2023-04-29 DIAGNOSIS — I2699 Other pulmonary embolism without acute cor pulmonale: Secondary | ICD-10-CM | POA: Diagnosis not present

## 2023-04-29 DIAGNOSIS — F0390 Unspecified dementia without behavioral disturbance: Secondary | ICD-10-CM | POA: Diagnosis not present

## 2023-04-29 DIAGNOSIS — C541 Malignant neoplasm of endometrium: Secondary | ICD-10-CM | POA: Diagnosis not present

## 2023-05-02 DIAGNOSIS — F0390 Unspecified dementia without behavioral disturbance: Secondary | ICD-10-CM | POA: Diagnosis not present

## 2023-05-02 DIAGNOSIS — E785 Hyperlipidemia, unspecified: Secondary | ICD-10-CM | POA: Diagnosis not present

## 2023-05-02 DIAGNOSIS — C541 Malignant neoplasm of endometrium: Secondary | ICD-10-CM | POA: Diagnosis not present

## 2023-05-02 DIAGNOSIS — I82402 Acute embolism and thrombosis of unspecified deep veins of left lower extremity: Secondary | ICD-10-CM | POA: Diagnosis not present

## 2023-05-02 DIAGNOSIS — I2699 Other pulmonary embolism without acute cor pulmonale: Secondary | ICD-10-CM | POA: Diagnosis not present

## 2023-05-02 DIAGNOSIS — I10 Essential (primary) hypertension: Secondary | ICD-10-CM | POA: Diagnosis not present

## 2023-05-06 DIAGNOSIS — I2699 Other pulmonary embolism without acute cor pulmonale: Secondary | ICD-10-CM | POA: Diagnosis not present

## 2023-05-06 DIAGNOSIS — F0390 Unspecified dementia without behavioral disturbance: Secondary | ICD-10-CM | POA: Diagnosis not present

## 2023-05-06 DIAGNOSIS — I10 Essential (primary) hypertension: Secondary | ICD-10-CM | POA: Diagnosis not present

## 2023-05-06 DIAGNOSIS — C541 Malignant neoplasm of endometrium: Secondary | ICD-10-CM | POA: Diagnosis not present

## 2023-05-06 DIAGNOSIS — E785 Hyperlipidemia, unspecified: Secondary | ICD-10-CM | POA: Diagnosis not present

## 2023-05-06 DIAGNOSIS — I82402 Acute embolism and thrombosis of unspecified deep veins of left lower extremity: Secondary | ICD-10-CM | POA: Diagnosis not present

## 2023-05-07 DIAGNOSIS — C541 Malignant neoplasm of endometrium: Secondary | ICD-10-CM | POA: Diagnosis not present

## 2023-05-07 DIAGNOSIS — I10 Essential (primary) hypertension: Secondary | ICD-10-CM | POA: Diagnosis not present

## 2023-05-07 DIAGNOSIS — I82402 Acute embolism and thrombosis of unspecified deep veins of left lower extremity: Secondary | ICD-10-CM | POA: Diagnosis not present

## 2023-05-07 DIAGNOSIS — F0390 Unspecified dementia without behavioral disturbance: Secondary | ICD-10-CM | POA: Diagnosis not present

## 2023-05-07 DIAGNOSIS — I2699 Other pulmonary embolism without acute cor pulmonale: Secondary | ICD-10-CM | POA: Diagnosis not present

## 2023-05-07 DIAGNOSIS — E785 Hyperlipidemia, unspecified: Secondary | ICD-10-CM | POA: Diagnosis not present

## 2023-05-07 DIAGNOSIS — L409 Psoriasis, unspecified: Secondary | ICD-10-CM | POA: Diagnosis not present

## 2023-05-09 DIAGNOSIS — I82402 Acute embolism and thrombosis of unspecified deep veins of left lower extremity: Secondary | ICD-10-CM | POA: Diagnosis not present

## 2023-05-09 DIAGNOSIS — I2699 Other pulmonary embolism without acute cor pulmonale: Secondary | ICD-10-CM | POA: Diagnosis not present

## 2023-05-09 DIAGNOSIS — I10 Essential (primary) hypertension: Secondary | ICD-10-CM | POA: Diagnosis not present

## 2023-05-09 DIAGNOSIS — C541 Malignant neoplasm of endometrium: Secondary | ICD-10-CM | POA: Diagnosis not present

## 2023-05-09 DIAGNOSIS — E785 Hyperlipidemia, unspecified: Secondary | ICD-10-CM | POA: Diagnosis not present

## 2023-05-09 DIAGNOSIS — F0390 Unspecified dementia without behavioral disturbance: Secondary | ICD-10-CM | POA: Diagnosis not present

## 2023-05-13 DIAGNOSIS — I82402 Acute embolism and thrombosis of unspecified deep veins of left lower extremity: Secondary | ICD-10-CM | POA: Diagnosis not present

## 2023-05-13 DIAGNOSIS — I10 Essential (primary) hypertension: Secondary | ICD-10-CM | POA: Diagnosis not present

## 2023-05-13 DIAGNOSIS — I2699 Other pulmonary embolism without acute cor pulmonale: Secondary | ICD-10-CM | POA: Diagnosis not present

## 2023-05-13 DIAGNOSIS — F0390 Unspecified dementia without behavioral disturbance: Secondary | ICD-10-CM | POA: Diagnosis not present

## 2023-05-13 DIAGNOSIS — E785 Hyperlipidemia, unspecified: Secondary | ICD-10-CM | POA: Diagnosis not present

## 2023-05-13 DIAGNOSIS — C541 Malignant neoplasm of endometrium: Secondary | ICD-10-CM | POA: Diagnosis not present

## 2023-05-14 DIAGNOSIS — C541 Malignant neoplasm of endometrium: Secondary | ICD-10-CM | POA: Diagnosis not present

## 2023-05-14 DIAGNOSIS — I10 Essential (primary) hypertension: Secondary | ICD-10-CM | POA: Diagnosis not present

## 2023-05-14 DIAGNOSIS — I82402 Acute embolism and thrombosis of unspecified deep veins of left lower extremity: Secondary | ICD-10-CM | POA: Diagnosis not present

## 2023-05-14 DIAGNOSIS — F0390 Unspecified dementia without behavioral disturbance: Secondary | ICD-10-CM | POA: Diagnosis not present

## 2023-05-14 DIAGNOSIS — E785 Hyperlipidemia, unspecified: Secondary | ICD-10-CM | POA: Diagnosis not present

## 2023-05-14 DIAGNOSIS — I2699 Other pulmonary embolism without acute cor pulmonale: Secondary | ICD-10-CM | POA: Diagnosis not present

## 2023-05-15 DIAGNOSIS — I82402 Acute embolism and thrombosis of unspecified deep veins of left lower extremity: Secondary | ICD-10-CM | POA: Diagnosis not present

## 2023-05-15 DIAGNOSIS — E785 Hyperlipidemia, unspecified: Secondary | ICD-10-CM | POA: Diagnosis not present

## 2023-05-15 DIAGNOSIS — F0390 Unspecified dementia without behavioral disturbance: Secondary | ICD-10-CM | POA: Diagnosis not present

## 2023-05-15 DIAGNOSIS — I10 Essential (primary) hypertension: Secondary | ICD-10-CM | POA: Diagnosis not present

## 2023-05-15 DIAGNOSIS — I2699 Other pulmonary embolism without acute cor pulmonale: Secondary | ICD-10-CM | POA: Diagnosis not present

## 2023-05-15 DIAGNOSIS — C541 Malignant neoplasm of endometrium: Secondary | ICD-10-CM | POA: Diagnosis not present

## 2023-05-16 DIAGNOSIS — E785 Hyperlipidemia, unspecified: Secondary | ICD-10-CM | POA: Diagnosis not present

## 2023-05-16 DIAGNOSIS — C541 Malignant neoplasm of endometrium: Secondary | ICD-10-CM | POA: Diagnosis not present

## 2023-05-16 DIAGNOSIS — I82402 Acute embolism and thrombosis of unspecified deep veins of left lower extremity: Secondary | ICD-10-CM | POA: Diagnosis not present

## 2023-05-16 DIAGNOSIS — F0390 Unspecified dementia without behavioral disturbance: Secondary | ICD-10-CM | POA: Diagnosis not present

## 2023-05-16 DIAGNOSIS — I10 Essential (primary) hypertension: Secondary | ICD-10-CM | POA: Diagnosis not present

## 2023-05-16 DIAGNOSIS — I2699 Other pulmonary embolism without acute cor pulmonale: Secondary | ICD-10-CM | POA: Diagnosis not present

## 2023-05-17 DIAGNOSIS — C541 Malignant neoplasm of endometrium: Secondary | ICD-10-CM | POA: Diagnosis not present

## 2023-05-17 DIAGNOSIS — I10 Essential (primary) hypertension: Secondary | ICD-10-CM | POA: Diagnosis not present

## 2023-05-17 DIAGNOSIS — I82402 Acute embolism and thrombosis of unspecified deep veins of left lower extremity: Secondary | ICD-10-CM | POA: Diagnosis not present

## 2023-05-17 DIAGNOSIS — I2699 Other pulmonary embolism without acute cor pulmonale: Secondary | ICD-10-CM | POA: Diagnosis not present

## 2023-05-17 DIAGNOSIS — E785 Hyperlipidemia, unspecified: Secondary | ICD-10-CM | POA: Diagnosis not present

## 2023-05-17 DIAGNOSIS — F0390 Unspecified dementia without behavioral disturbance: Secondary | ICD-10-CM | POA: Diagnosis not present

## 2023-05-20 DIAGNOSIS — I10 Essential (primary) hypertension: Secondary | ICD-10-CM | POA: Diagnosis not present

## 2023-05-20 DIAGNOSIS — I2699 Other pulmonary embolism without acute cor pulmonale: Secondary | ICD-10-CM | POA: Diagnosis not present

## 2023-05-20 DIAGNOSIS — F0390 Unspecified dementia without behavioral disturbance: Secondary | ICD-10-CM | POA: Diagnosis not present

## 2023-05-20 DIAGNOSIS — C541 Malignant neoplasm of endometrium: Secondary | ICD-10-CM | POA: Diagnosis not present

## 2023-05-20 DIAGNOSIS — E785 Hyperlipidemia, unspecified: Secondary | ICD-10-CM | POA: Diagnosis not present

## 2023-05-20 DIAGNOSIS — I82402 Acute embolism and thrombosis of unspecified deep veins of left lower extremity: Secondary | ICD-10-CM | POA: Diagnosis not present

## 2023-05-21 DIAGNOSIS — I10 Essential (primary) hypertension: Secondary | ICD-10-CM | POA: Diagnosis not present

## 2023-05-21 DIAGNOSIS — F0390 Unspecified dementia without behavioral disturbance: Secondary | ICD-10-CM | POA: Diagnosis not present

## 2023-05-21 DIAGNOSIS — E785 Hyperlipidemia, unspecified: Secondary | ICD-10-CM | POA: Diagnosis not present

## 2023-05-21 DIAGNOSIS — I2699 Other pulmonary embolism without acute cor pulmonale: Secondary | ICD-10-CM | POA: Diagnosis not present

## 2023-05-21 DIAGNOSIS — C541 Malignant neoplasm of endometrium: Secondary | ICD-10-CM | POA: Diagnosis not present

## 2023-05-21 DIAGNOSIS — I82402 Acute embolism and thrombosis of unspecified deep veins of left lower extremity: Secondary | ICD-10-CM | POA: Diagnosis not present

## 2023-05-23 DIAGNOSIS — F0390 Unspecified dementia without behavioral disturbance: Secondary | ICD-10-CM | POA: Diagnosis not present

## 2023-05-23 DIAGNOSIS — C541 Malignant neoplasm of endometrium: Secondary | ICD-10-CM | POA: Diagnosis not present

## 2023-05-23 DIAGNOSIS — I2699 Other pulmonary embolism without acute cor pulmonale: Secondary | ICD-10-CM | POA: Diagnosis not present

## 2023-05-23 DIAGNOSIS — I82402 Acute embolism and thrombosis of unspecified deep veins of left lower extremity: Secondary | ICD-10-CM | POA: Diagnosis not present

## 2023-05-23 DIAGNOSIS — E785 Hyperlipidemia, unspecified: Secondary | ICD-10-CM | POA: Diagnosis not present

## 2023-05-23 DIAGNOSIS — I10 Essential (primary) hypertension: Secondary | ICD-10-CM | POA: Diagnosis not present

## 2023-05-24 DIAGNOSIS — I2699 Other pulmonary embolism without acute cor pulmonale: Secondary | ICD-10-CM | POA: Diagnosis not present

## 2023-05-24 DIAGNOSIS — C541 Malignant neoplasm of endometrium: Secondary | ICD-10-CM | POA: Diagnosis not present

## 2023-05-24 DIAGNOSIS — F0390 Unspecified dementia without behavioral disturbance: Secondary | ICD-10-CM | POA: Diagnosis not present

## 2023-05-24 DIAGNOSIS — I10 Essential (primary) hypertension: Secondary | ICD-10-CM | POA: Diagnosis not present

## 2023-05-24 DIAGNOSIS — I82402 Acute embolism and thrombosis of unspecified deep veins of left lower extremity: Secondary | ICD-10-CM | POA: Diagnosis not present

## 2023-05-24 DIAGNOSIS — E785 Hyperlipidemia, unspecified: Secondary | ICD-10-CM | POA: Diagnosis not present

## 2023-05-27 DIAGNOSIS — E785 Hyperlipidemia, unspecified: Secondary | ICD-10-CM | POA: Diagnosis not present

## 2023-05-27 DIAGNOSIS — C541 Malignant neoplasm of endometrium: Secondary | ICD-10-CM | POA: Diagnosis not present

## 2023-05-27 DIAGNOSIS — I10 Essential (primary) hypertension: Secondary | ICD-10-CM | POA: Diagnosis not present

## 2023-05-27 DIAGNOSIS — I2699 Other pulmonary embolism without acute cor pulmonale: Secondary | ICD-10-CM | POA: Diagnosis not present

## 2023-05-27 DIAGNOSIS — I82402 Acute embolism and thrombosis of unspecified deep veins of left lower extremity: Secondary | ICD-10-CM | POA: Diagnosis not present

## 2023-05-27 DIAGNOSIS — F0390 Unspecified dementia without behavioral disturbance: Secondary | ICD-10-CM | POA: Diagnosis not present

## 2023-05-28 DIAGNOSIS — I82402 Acute embolism and thrombosis of unspecified deep veins of left lower extremity: Secondary | ICD-10-CM | POA: Diagnosis not present

## 2023-05-28 DIAGNOSIS — C541 Malignant neoplasm of endometrium: Secondary | ICD-10-CM | POA: Diagnosis not present

## 2023-05-28 DIAGNOSIS — I2699 Other pulmonary embolism without acute cor pulmonale: Secondary | ICD-10-CM | POA: Diagnosis not present

## 2023-05-28 DIAGNOSIS — F0390 Unspecified dementia without behavioral disturbance: Secondary | ICD-10-CM | POA: Diagnosis not present

## 2023-05-28 DIAGNOSIS — E785 Hyperlipidemia, unspecified: Secondary | ICD-10-CM | POA: Diagnosis not present

## 2023-05-28 DIAGNOSIS — I10 Essential (primary) hypertension: Secondary | ICD-10-CM | POA: Diagnosis not present

## 2023-05-30 DIAGNOSIS — C541 Malignant neoplasm of endometrium: Secondary | ICD-10-CM | POA: Diagnosis not present

## 2023-05-30 DIAGNOSIS — E785 Hyperlipidemia, unspecified: Secondary | ICD-10-CM | POA: Diagnosis not present

## 2023-05-30 DIAGNOSIS — I2699 Other pulmonary embolism without acute cor pulmonale: Secondary | ICD-10-CM | POA: Diagnosis not present

## 2023-05-30 DIAGNOSIS — F0390 Unspecified dementia without behavioral disturbance: Secondary | ICD-10-CM | POA: Diagnosis not present

## 2023-05-30 DIAGNOSIS — I10 Essential (primary) hypertension: Secondary | ICD-10-CM | POA: Diagnosis not present

## 2023-05-30 DIAGNOSIS — I82402 Acute embolism and thrombosis of unspecified deep veins of left lower extremity: Secondary | ICD-10-CM | POA: Diagnosis not present

## 2023-05-31 DIAGNOSIS — I10 Essential (primary) hypertension: Secondary | ICD-10-CM | POA: Diagnosis not present

## 2023-05-31 DIAGNOSIS — I2699 Other pulmonary embolism without acute cor pulmonale: Secondary | ICD-10-CM | POA: Diagnosis not present

## 2023-05-31 DIAGNOSIS — C541 Malignant neoplasm of endometrium: Secondary | ICD-10-CM | POA: Diagnosis not present

## 2023-05-31 DIAGNOSIS — F0390 Unspecified dementia without behavioral disturbance: Secondary | ICD-10-CM | POA: Diagnosis not present

## 2023-05-31 DIAGNOSIS — E785 Hyperlipidemia, unspecified: Secondary | ICD-10-CM | POA: Diagnosis not present

## 2023-05-31 DIAGNOSIS — I82402 Acute embolism and thrombosis of unspecified deep veins of left lower extremity: Secondary | ICD-10-CM | POA: Diagnosis not present

## 2023-06-03 DIAGNOSIS — C541 Malignant neoplasm of endometrium: Secondary | ICD-10-CM | POA: Diagnosis not present

## 2023-06-03 DIAGNOSIS — I2699 Other pulmonary embolism without acute cor pulmonale: Secondary | ICD-10-CM | POA: Diagnosis not present

## 2023-06-03 DIAGNOSIS — F0390 Unspecified dementia without behavioral disturbance: Secondary | ICD-10-CM | POA: Diagnosis not present

## 2023-06-03 DIAGNOSIS — I82402 Acute embolism and thrombosis of unspecified deep veins of left lower extremity: Secondary | ICD-10-CM | POA: Diagnosis not present

## 2023-06-03 DIAGNOSIS — I10 Essential (primary) hypertension: Secondary | ICD-10-CM | POA: Diagnosis not present

## 2023-06-03 DIAGNOSIS — E785 Hyperlipidemia, unspecified: Secondary | ICD-10-CM | POA: Diagnosis not present

## 2023-06-04 DIAGNOSIS — C541 Malignant neoplasm of endometrium: Secondary | ICD-10-CM | POA: Diagnosis not present

## 2023-06-04 DIAGNOSIS — I10 Essential (primary) hypertension: Secondary | ICD-10-CM | POA: Diagnosis not present

## 2023-06-04 DIAGNOSIS — I82402 Acute embolism and thrombosis of unspecified deep veins of left lower extremity: Secondary | ICD-10-CM | POA: Diagnosis not present

## 2023-06-04 DIAGNOSIS — F0390 Unspecified dementia without behavioral disturbance: Secondary | ICD-10-CM | POA: Diagnosis not present

## 2023-06-04 DIAGNOSIS — E785 Hyperlipidemia, unspecified: Secondary | ICD-10-CM | POA: Diagnosis not present

## 2023-06-04 DIAGNOSIS — I2699 Other pulmonary embolism without acute cor pulmonale: Secondary | ICD-10-CM | POA: Diagnosis not present

## 2023-06-06 DIAGNOSIS — F0390 Unspecified dementia without behavioral disturbance: Secondary | ICD-10-CM | POA: Diagnosis not present

## 2023-06-06 DIAGNOSIS — I10 Essential (primary) hypertension: Secondary | ICD-10-CM | POA: Diagnosis not present

## 2023-06-06 DIAGNOSIS — E785 Hyperlipidemia, unspecified: Secondary | ICD-10-CM | POA: Diagnosis not present

## 2023-06-06 DIAGNOSIS — I2699 Other pulmonary embolism without acute cor pulmonale: Secondary | ICD-10-CM | POA: Diagnosis not present

## 2023-06-06 DIAGNOSIS — I82402 Acute embolism and thrombosis of unspecified deep veins of left lower extremity: Secondary | ICD-10-CM | POA: Diagnosis not present

## 2023-06-06 DIAGNOSIS — C541 Malignant neoplasm of endometrium: Secondary | ICD-10-CM | POA: Diagnosis not present

## 2023-06-07 DIAGNOSIS — I10 Essential (primary) hypertension: Secondary | ICD-10-CM | POA: Diagnosis not present

## 2023-06-07 DIAGNOSIS — L409 Psoriasis, unspecified: Secondary | ICD-10-CM | POA: Diagnosis not present

## 2023-06-07 DIAGNOSIS — I2699 Other pulmonary embolism without acute cor pulmonale: Secondary | ICD-10-CM | POA: Diagnosis not present

## 2023-06-07 DIAGNOSIS — F0390 Unspecified dementia without behavioral disturbance: Secondary | ICD-10-CM | POA: Diagnosis not present

## 2023-06-07 DIAGNOSIS — I82402 Acute embolism and thrombosis of unspecified deep veins of left lower extremity: Secondary | ICD-10-CM | POA: Diagnosis not present

## 2023-06-07 DIAGNOSIS — E785 Hyperlipidemia, unspecified: Secondary | ICD-10-CM | POA: Diagnosis not present

## 2023-06-07 DIAGNOSIS — C541 Malignant neoplasm of endometrium: Secondary | ICD-10-CM | POA: Diagnosis not present

## 2023-06-10 DIAGNOSIS — C541 Malignant neoplasm of endometrium: Secondary | ICD-10-CM | POA: Diagnosis not present

## 2023-06-10 DIAGNOSIS — E785 Hyperlipidemia, unspecified: Secondary | ICD-10-CM | POA: Diagnosis not present

## 2023-06-10 DIAGNOSIS — I2699 Other pulmonary embolism without acute cor pulmonale: Secondary | ICD-10-CM | POA: Diagnosis not present

## 2023-06-10 DIAGNOSIS — F0390 Unspecified dementia without behavioral disturbance: Secondary | ICD-10-CM | POA: Diagnosis not present

## 2023-06-10 DIAGNOSIS — I82402 Acute embolism and thrombosis of unspecified deep veins of left lower extremity: Secondary | ICD-10-CM | POA: Diagnosis not present

## 2023-06-10 DIAGNOSIS — I10 Essential (primary) hypertension: Secondary | ICD-10-CM | POA: Diagnosis not present

## 2023-06-11 DIAGNOSIS — F0390 Unspecified dementia without behavioral disturbance: Secondary | ICD-10-CM | POA: Diagnosis not present

## 2023-06-11 DIAGNOSIS — I2699 Other pulmonary embolism without acute cor pulmonale: Secondary | ICD-10-CM | POA: Diagnosis not present

## 2023-06-11 DIAGNOSIS — C541 Malignant neoplasm of endometrium: Secondary | ICD-10-CM | POA: Diagnosis not present

## 2023-06-11 DIAGNOSIS — I10 Essential (primary) hypertension: Secondary | ICD-10-CM | POA: Diagnosis not present

## 2023-06-11 DIAGNOSIS — I82402 Acute embolism and thrombosis of unspecified deep veins of left lower extremity: Secondary | ICD-10-CM | POA: Diagnosis not present

## 2023-06-11 DIAGNOSIS — E785 Hyperlipidemia, unspecified: Secondary | ICD-10-CM | POA: Diagnosis not present

## 2023-06-13 DIAGNOSIS — I10 Essential (primary) hypertension: Secondary | ICD-10-CM | POA: Diagnosis not present

## 2023-06-13 DIAGNOSIS — I82402 Acute embolism and thrombosis of unspecified deep veins of left lower extremity: Secondary | ICD-10-CM | POA: Diagnosis not present

## 2023-06-13 DIAGNOSIS — E785 Hyperlipidemia, unspecified: Secondary | ICD-10-CM | POA: Diagnosis not present

## 2023-06-13 DIAGNOSIS — I2699 Other pulmonary embolism without acute cor pulmonale: Secondary | ICD-10-CM | POA: Diagnosis not present

## 2023-06-13 DIAGNOSIS — F0390 Unspecified dementia without behavioral disturbance: Secondary | ICD-10-CM | POA: Diagnosis not present

## 2023-06-13 DIAGNOSIS — C541 Malignant neoplasm of endometrium: Secondary | ICD-10-CM | POA: Diagnosis not present

## 2023-06-14 DIAGNOSIS — F0390 Unspecified dementia without behavioral disturbance: Secondary | ICD-10-CM | POA: Diagnosis not present

## 2023-06-14 DIAGNOSIS — I10 Essential (primary) hypertension: Secondary | ICD-10-CM | POA: Diagnosis not present

## 2023-06-14 DIAGNOSIS — E785 Hyperlipidemia, unspecified: Secondary | ICD-10-CM | POA: Diagnosis not present

## 2023-06-14 DIAGNOSIS — I2699 Other pulmonary embolism without acute cor pulmonale: Secondary | ICD-10-CM | POA: Diagnosis not present

## 2023-06-14 DIAGNOSIS — C541 Malignant neoplasm of endometrium: Secondary | ICD-10-CM | POA: Diagnosis not present

## 2023-06-14 DIAGNOSIS — I82402 Acute embolism and thrombosis of unspecified deep veins of left lower extremity: Secondary | ICD-10-CM | POA: Diagnosis not present

## 2023-06-17 DIAGNOSIS — F0390 Unspecified dementia without behavioral disturbance: Secondary | ICD-10-CM | POA: Diagnosis not present

## 2023-06-17 DIAGNOSIS — C541 Malignant neoplasm of endometrium: Secondary | ICD-10-CM | POA: Diagnosis not present

## 2023-06-17 DIAGNOSIS — I10 Essential (primary) hypertension: Secondary | ICD-10-CM | POA: Diagnosis not present

## 2023-06-17 DIAGNOSIS — E785 Hyperlipidemia, unspecified: Secondary | ICD-10-CM | POA: Diagnosis not present

## 2023-06-17 DIAGNOSIS — I2699 Other pulmonary embolism without acute cor pulmonale: Secondary | ICD-10-CM | POA: Diagnosis not present

## 2023-06-17 DIAGNOSIS — I82402 Acute embolism and thrombosis of unspecified deep veins of left lower extremity: Secondary | ICD-10-CM | POA: Diagnosis not present

## 2023-06-18 DIAGNOSIS — I82402 Acute embolism and thrombosis of unspecified deep veins of left lower extremity: Secondary | ICD-10-CM | POA: Diagnosis not present

## 2023-06-18 DIAGNOSIS — I10 Essential (primary) hypertension: Secondary | ICD-10-CM | POA: Diagnosis not present

## 2023-06-18 DIAGNOSIS — C541 Malignant neoplasm of endometrium: Secondary | ICD-10-CM | POA: Diagnosis not present

## 2023-06-18 DIAGNOSIS — E785 Hyperlipidemia, unspecified: Secondary | ICD-10-CM | POA: Diagnosis not present

## 2023-06-18 DIAGNOSIS — F0390 Unspecified dementia without behavioral disturbance: Secondary | ICD-10-CM | POA: Diagnosis not present

## 2023-06-18 DIAGNOSIS — I2699 Other pulmonary embolism without acute cor pulmonale: Secondary | ICD-10-CM | POA: Diagnosis not present

## 2023-07-07 DEATH — deceased
